# Patient Record
Sex: Male | Born: 1995
Health system: Southern US, Community
[De-identification: ages and names within clinical notes are randomized; demographics above are authoritative.]

## PROBLEM LIST (undated history)

## (undated) DIAGNOSIS — Z91030 Bee allergy status: Secondary | ICD-10-CM

## (undated) HISTORY — DX: Bee allergy status: Z91.030

## (undated) HISTORY — PX: MYRINGOTOMY: SUR874

---
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
## 2017-08-22 ENCOUNTER — Ambulatory Visit (INDEPENDENT_AMBULATORY_CARE_PROVIDER_SITE_OTHER): Payer: PRIVATE HEALTH INSURANCE | Admitting: Family Medicine

## 2017-08-22 ENCOUNTER — Encounter: Payer: Self-pay | Admitting: Family Medicine

## 2017-08-22 DIAGNOSIS — J029 Acute pharyngitis, unspecified: Secondary | ICD-10-CM | POA: Diagnosis not present

## 2017-08-22 MED ORDER — EPINEPHRINE 0.3 MG/0.3ML IJ SOAJ: 0.3000 mg | Freq: Once | INTRAMUSCULAR | 2 refills | Status: AC

## 2017-08-22 MED ORDER — AMOXICILLIN-POT CLAVULANATE 875-125 MG PO TABS: 1.0000 | ORAL_TABLET | Freq: Two times a day (BID) | ORAL | 0 refills | Status: DC

## 2017-08-22 NOTE — Progress Notes (Signed)
Subjective:    Patient ID: Brandon Mayer, male    DOB: 29-Jan-1996, 22 y.o.   MRN: [id number]  HPI  Here for ST and ear pain  Some chills and then hot   Started getting sick on Monday night- R ear hurt a lot  Then yesterday throat started to hurt / last night everything hurt-both ears Hurts to swallow   Some cough- not bad / can feel some phlegm but it will not come up   Nose is a little runny   Headache-not facial pain/ not severe/comes and goes   Family has been sick with uri symptoms   Temp: 100.3 F (37.9 C)  Ibuprofen - last dose at noon  Cough drops   No other otc medicines  Drinking water   Has never had mono    Patient Active Problem List   Diagnosis Date Noted  . Pharyngitis 08/22/2017  . Diarrhea 08/15/2015  . Well child check 12/09/2010  . Bee sting allergy 12/09/2010   Past Medical History:  Diagnosis Date  . Bee sting allergy    Past Surgical History:  Procedure Laterality Date  . MYRINGOTOMY     Social History   Tobacco Use  . Smoking status: Never Smoker  Substance Use Topics  . Alcohol use: No  . Drug use: No   Family History  Problem Relation Age of Onset  . Gestational diabetes Mother   . Heart murmur Mother    No Known Allergies No current outpatient medications on file prior to visit.   No current facility-administered medications on file prior to visit.      Review of Systems  Constitutional: Positive for fatigue and fever. Negative for activity change, appetite change and unexpected weight change.  HENT: Positive for ear pain, postnasal drip, sore throat, trouble swallowing and voice change. Negative for congestion, drooling, facial swelling, mouth sores, rhinorrhea, sinus pressure and sinus pain.   Eyes: Negative for pain, redness, itching and visual disturbance.  Respiratory: Negative for cough, chest tightness, shortness of breath and wheezing.   Cardiovascular: Negative for chest pain and palpitations.    Gastrointestinal: Negative for abdominal pain, blood in stool, constipation, diarrhea and nausea.  Endocrine: Negative for cold intolerance, heat intolerance, polydipsia and polyuria.  Genitourinary: Negative for difficulty urinating, dysuria, frequency and urgency.  Musculoskeletal: Negative for arthralgias, joint swelling and myalgias.  Skin: Negative for pallor and rash.  Neurological: Negative for dizziness, tremors, weakness, numbness and headaches.  Hematological: Negative for adenopathy. Does not bruise/bleed easily.  Psychiatric/Behavioral: Negative for decreased concentration and dysphoric mood. The patient is not nervous/anxious.        Objective:   Physical Exam  Constitutional: He appears well-developed and well-nourished. He does not appear ill.  Well appearing - but fatigued   HENT:  Head: Normocephalic and atraumatic.  Right Ear: Tympanic membrane and ear canal normal. No drainage, swelling or tenderness. No middle ear effusion.  Left Ear: Tympanic membrane and ear canal normal. No drainage, swelling or tenderness.  No middle ear effusion.  Mouth/Throat: Mucous membranes are normal. No oral lesions. No uvula swelling. Oropharyngeal exudate, posterior oropharyngeal edema and posterior oropharyngeal erythema present. No tonsillar abscesses. Tonsils are 1+ on the right. Tonsils are 1+ on the left. Tonsillar exudate.  Diffusely swollen tonsils with bright erythema  Scant exudate on R side   Eyes: Pupils are equal, round, and reactive to light. EOM are normal.  Neck: Normal range of motion. Neck supple. No thyromegaly present.  Cardiovascular:  Regular rhythm.  No murmur heard. Pulmonary/Chest: Effort normal and breath sounds normal. No stridor. No respiratory distress. He has no wheezes. He has no rhonchi. He has no rales. He exhibits no tenderness.  Lymphadenopathy:    He has no cervical adenopathy.  Neurological: He is alert.  Skin: Skin is warm and dry. No rash noted.   Psychiatric: He has a normal mood and affect.          Assessment & Plan:   Problem List Items Addressed This Visit      Respiratory   Pharyngitis    Throat pain and swelling w/o palpable adenopathy  Some exudate Neg RST Will cx and cover with augmentin in light of severity  Disc symptomatic care - see instructions on AVS Rest/fluids  Consider mono test if no imp (less likely with no LNs)      Relevant Orders   Culture, Group A Strep

## 2017-08-22 NOTE — Patient Instructions (Signed)
Drink lots of fluids  Ibuprofen - make sure something to eat with that  You can take 3 at a time (600 mg) up to every 8 hours   Chloraseptic throat spray can help  Watch for rash and let me know   I sent a throat culture - we will contact you with results   Take the augmentin as directed  Take with a little food if you can   Get some rest   If you cannot swallow - or any breathing problems-please go to the emergency room

## 2017-08-23 NOTE — Assessment & Plan Note (Signed)
Throat pain and swelling w/o palpable adenopathy  Some exudate Neg RST Will cx and cover with augmentin in light of severity  Disc symptomatic care - see instructions on AVS Rest/fluids  Consider mono test if no imp (less likely with no LNs)

## 2017-08-24 LAB — CULTURE, GROUP A STREP
MICRO NUMBER:: 90531636
SPECIMEN QUALITY:: ADEQUATE

## 2018-01-17 ENCOUNTER — Encounter: Payer: Self-pay | Admitting: Family Medicine

## 2018-01-17 ENCOUNTER — Ambulatory Visit (INDEPENDENT_AMBULATORY_CARE_PROVIDER_SITE_OTHER): Payer: Commercial Managed Care - PPO | Admitting: Family Medicine

## 2018-01-17 ENCOUNTER — Encounter (INDEPENDENT_AMBULATORY_CARE_PROVIDER_SITE_OTHER): Payer: Self-pay

## 2018-01-17 VITALS — BP 136/78 | HR 82 | Temp 99.1°F | Ht 71.0 in | Wt 254.8 lb

## 2018-01-17 DIAGNOSIS — Z0000 Encounter for general adult medical examination without abnormal findings: Secondary | ICD-10-CM | POA: Diagnosis not present

## 2018-01-17 DIAGNOSIS — Z23 Encounter for immunization: Secondary | ICD-10-CM

## 2018-01-17 LAB — CBC WITH DIFFERENTIAL/PLATELET
BASOS ABS: 0.1 10*3/uL (ref 0.0–0.1)
Basophils Relative: 1.2 % (ref 0.0–3.0)
EOS ABS: 0.1 10*3/uL (ref 0.0–0.7)
Eosinophils Relative: 1.7 % (ref 0.0–5.0)
HCT: 48.3 % (ref 39.0–52.0)
Hemoglobin: 16.5 g/dL (ref 13.0–17.0)
LYMPHS ABS: 2 10*3/uL (ref 0.7–4.0)
LYMPHS PCT: 30.6 % (ref 12.0–46.0)
MCHC: 34.2 g/dL (ref 30.0–36.0)
MCV: 84.6 fl (ref 78.0–100.0)
Monocytes Absolute: 0.6 10*3/uL (ref 0.1–1.0)
Monocytes Relative: 9.6 % (ref 3.0–12.0)
NEUTROS ABS: 3.7 10*3/uL (ref 1.4–7.7)
NEUTROS PCT: 56.9 % (ref 43.0–77.0)
PLATELETS: 327 10*3/uL (ref 150.0–400.0)
RBC: 5.71 Mil/uL (ref 4.22–5.81)
RDW: 12.7 % (ref 11.5–14.6)
WBC: 6.5 10*3/uL (ref 4.5–10.5)

## 2018-01-17 LAB — COMPREHENSIVE METABOLIC PANEL
ALT: 49 U/L (ref 0–53)
AST: 24 U/L (ref 0–37)
Albumin: 5 g/dL (ref 3.5–5.2)
Alkaline Phosphatase: 57 U/L (ref 39–117)
BILIRUBIN TOTAL: 0.8 mg/dL (ref 0.2–1.2)
BUN: 13 mg/dL (ref 6–23)
CO2: 29 meq/L (ref 19–32)
CREATININE: 0.81 mg/dL (ref 0.40–1.50)
Calcium: 10 mg/dL (ref 8.4–10.5)
Chloride: 101 mEq/L (ref 96–112)
GFR: 129.02 mL/min (ref >60.00)
GLUCOSE: 94 mg/dL (ref 70–99)
Potassium: 4.3 mEq/L (ref 3.5–5.1)
Sodium: 139 mEq/L (ref 135–145)
Total Protein: 7.9 g/dL (ref 6.0–8.3)

## 2018-01-17 LAB — LIPID PANEL
CHOL/HDL RATIO: 4
Cholesterol: 157 mg/dL (ref 0–200)
HDL: 38.9 mg/dL — ABNORMAL LOW (ref >39.00)
LDL Cholesterol: 109 mg/dL — ABNORMAL HIGH (ref 0–99)
NONHDL: 118.21
Triglycerides: 46 mg/dL (ref 0.0–149.0)
VLDL: 9.2 mg/dL (ref 0.0–40.0)

## 2018-01-17 LAB — TSH: TSH: 1.56 u[IU]/mL (ref 0.35–5.50)

## 2018-01-17 NOTE — Progress Notes (Signed)
Subjective:    Patient ID: Brandon Mayer, male    DOB: [birth year], 22 y.o.   MRN: [id number]  HPI  Here for health maintenance exam and to review chronic medical problems    Grad with Games developer degree Works at [employer] ([employer]) -- really likes it  Likes it   [employer] from Last 3 Encounters:  01/17/18 254 lb 12 oz (115.6 kg)  08/22/17 259 lb (117.5 kg) (>99 %, Z= 2.54)*  10/15/13 236 lb (107 kg) (>99 %, Z= 2.67)*   * Growth percentiles are based on CDC (Boys, 2-20 Years) data.  is taking care of himself  Started a healthier diet recently - eating less bread and more protein and veg and fruit  Exercise- walking / playing with dog (working long hours)  35.53 kg/m   HIV/STD screening  Not interested /not needed   Tetanus shot 3/09 - needs it   Flu shot -will get at work   HPV vaccine -not interested   fam hx  Mother - gestational DM and heart M MGF with prostate cancer - late in life   Has a form to fill out for work   Needs labs   Patient Active Problem List   Diagnosis Date Noted  . Routine general medical examination at a health care facility 01/17/2018  . Diarrhea 08/15/2015  . Well child check 12/09/2010  . Bee sting allergy 12/09/2010   Past Medical History:  Diagnosis Date  . Bee sting allergy    Past Surgical History:  Procedure Laterality Date  . MYRINGOTOMY     Social History   Tobacco Use  . Smoking status: Never Smoker  . Smokeless tobacco: Never Used  Substance Use Topics  . Alcohol use: No  . Drug use: No   Family History  Problem Relation Age of Onset  . Gestational diabetes Mother   . Heart murmur Mother    No Known Allergies No current outpatient medications on file prior to visit.   No current facility-administered medications on file prior to visit.      Review of Systems  Constitutional: Negative for activity change, appetite change, fatigue, fever and unexpected weight change.  HENT: Negative for  congestion, rhinorrhea, sore throat and trouble swallowing.   Eyes: Negative for pain, redness, itching and visual disturbance.  Respiratory: Negative for cough, chest tightness, shortness of breath and wheezing.   Cardiovascular: Negative for chest pain and palpitations.  Gastrointestinal: Negative for abdominal pain, blood in stool, constipation, diarrhea and nausea.  Endocrine: Negative for cold intolerance, heat intolerance, polydipsia and polyuria.  Genitourinary: Negative for difficulty urinating, dysuria, frequency and urgency.  Musculoskeletal: Negative for arthralgias, joint swelling and myalgias.  Skin: Negative for pallor and rash.  Neurological: Negative for dizziness, tremors, weakness, numbness and headaches.  Hematological: Negative for adenopathy. Does not bruise/bleed easily.  Psychiatric/Behavioral: Negative for decreased concentration and dysphoric mood. The patient is not nervous/anxious.        Objective:   Physical Exam  Constitutional: He appears well-developed and well-nourished. No distress.  obese and well appearing   HENT:  Head: Normocephalic and atraumatic.  Right Ear: External ear normal.  Left Ear: External ear normal.  Nose: Nose normal.  Mouth/Throat: Oropharynx is clear and moist.  Eyes: Pupils are equal, round, and reactive to light. Conjunctivae and EOM are normal. Right eye exhibits no discharge. Left eye exhibits no discharge. No scleral icterus.  Neck: Normal range of motion. Neck supple. No JVD present. Carotid  bruit is not present. No thyromegaly present.  Cardiovascular: Normal rate, regular rhythm, normal heart sounds and intact distal pulses. Exam reveals no gallop.  Pulmonary/Chest: Effort normal and breath sounds normal. No respiratory distress. He has no wheezes. He exhibits no tenderness.  Abdominal: Soft. Bowel sounds are normal. He exhibits no distension, no abdominal bruit and no mass. There is no tenderness.  Musculoskeletal: He  exhibits no edema, tenderness or deformity.  Lymphadenopathy:    He has no cervical adenopathy.  Neurological: He is alert. He has normal reflexes. He displays normal reflexes. No cranial nerve deficit. He exhibits normal muscle tone. Coordination normal.  Skin: Skin is warm and dry. No rash noted. No erythema. No pallor.  Few skin tags on neck  Small brown nevi /flat on back   Psychiatric: He has a normal mood and affect. His mood appears not anxious. He does not exhibit a depressed mood.  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

## 2018-01-17 NOTE — Patient Instructions (Signed)
Take care of yourself  Keep working on healthy diet and exercise  Also -work to get enough sleep  Labs today  I will get your form finished

## 2018-01-17 NOTE — Assessment & Plan Note (Signed)
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

## 2018-05-17 ENCOUNTER — Ambulatory Visit: Payer: Commercial Managed Care - PPO | Admitting: Family Medicine

## 2018-05-17 ENCOUNTER — Encounter: Payer: Self-pay | Admitting: Family Medicine

## 2018-05-17 VITALS — BP 130/76 | HR 89 | Temp 98.2°F | Ht 71.0 in | Wt 260.5 lb

## 2018-05-17 DIAGNOSIS — R109 Unspecified abdominal pain: Secondary | ICD-10-CM | POA: Insufficient documentation

## 2018-05-17 DIAGNOSIS — R110 Nausea: Secondary | ICD-10-CM | POA: Insufficient documentation

## 2018-05-17 DIAGNOSIS — R1084 Generalized abdominal pain: Secondary | ICD-10-CM | POA: Diagnosis not present

## 2018-05-17 NOTE — Patient Instructions (Signed)
Eat a little cleaner  Less processed/junk food  Less fats   More fruits and vegetables  Continue good water intake   Avoid red meat/ fried foods/ egg yolks/ fatty breakfast meats/ butter, cheese and high fat dairy/ and shellfish    When you have an episode -make note of what you ate   We will set up an ultrasound and take a look at your gallbladder

## 2018-05-17 NOTE — Assessment & Plan Note (Signed)
With abd pain after eating  See A/P for abd pain  [place] abd ordered

## 2018-05-17 NOTE — Progress Notes (Signed)
Subjective:    Patient ID: Brandon Mayer, male    DOB: 09-02-1995, 23 y.o.   MRN: [id number]  HPI  Here for symptom of abd pain after eating   Wt Readings from Last 3 Encounters:  05/17/18 260 lb 8 oz (118.2 kg)  01/17/18 254 lb 12 oz (115.6 kg)  08/22/17 259 lb (117.5 kg) (>99 %, Z= 2.54)*   * Growth percentiles are based on CDC (Boys, 2-20 Years) data.   36.33 kg/m   I get rather sick after he eats  About once per week   For instance - ate lunch this weekend  15 min later- feel bad  Nauseated but no vomiting  abd pain and bloating (whole abdomen) - achey pain  No cramping  Not worse on one side or the other  Sometimes gets diarrhea /not every time  No blood in stool No black stool  No fever   Once it was a milk shake - ? Lactose intol Other times nothing to do with dairy   Tried bentyl in the past-no help   No family hx of GI problems   He drinks occasional coffee/not much Alcohol - every 2 weeks at most   Diet is generally not healthy   No anxiety or stressors lately  Had labs in the fall Lab Results  Component Value Date   WBC 6.5 01/17/2018   HGB 16.5 01/17/2018   HCT 48.3 01/17/2018   MCV 84.6 01/17/2018   PLT 327.0 01/17/2018   Lab Results  Component Value Date   ALT 49 01/17/2018   AST 24 01/17/2018   ALKPHOS 57 01/17/2018   BILITOT 0.8 01/17/2018   Lab Results  Component Value Date   CREATININE 0.81 01/17/2018   BUN 13 01/17/2018   NA 139 01/17/2018   K 4.3 01/17/2018   CL 101 01/17/2018   CO2 29 01/17/2018     Patient Active Problem List   Diagnosis Date Noted  . Abdominal pain 05/17/2018  . Nausea 05/17/2018  . Routine general medical examination at a health care facility 01/17/2018  . Bee sting allergy 12/09/2010   Past Medical History:  Diagnosis Date  . Bee sting allergy    Past Surgical History:  Procedure Laterality Date  . MYRINGOTOMY     Social History   Tobacco Use  . Smoking status: Never Smoker  .  Smokeless tobacco: Never Used  Substance Use Topics  . Alcohol use: No  . Drug use: No   Family History  Problem Relation Age of Onset  . Gestational diabetes Mother   . Heart murmur Mother    No Known Allergies No current outpatient medications on file prior to visit.   No current facility-administered medications on file prior to visit.     Review of Systems  Constitutional: Negative for activity change, appetite change, fatigue, fever and unexpected weight change.  HENT: Negative for congestion, rhinorrhea, sore throat and trouble swallowing.   Eyes: Negative for pain, redness, itching and visual disturbance.  Respiratory: Negative for cough, chest tightness, shortness of breath and wheezing.   Cardiovascular: Negative for chest pain, palpitations and leg swelling.  Gastrointestinal: Positive for abdominal distention, abdominal pain, diarrhea and nausea. Negative for anal bleeding, blood in stool, constipation, rectal pain and vomiting.  Endocrine: Negative for cold intolerance, heat intolerance, polydipsia and polyuria.  Genitourinary: Negative for difficulty urinating, dysuria, frequency and urgency.  Musculoskeletal: Negative for arthralgias, joint swelling and myalgias.  Skin: Negative for pallor and rash.  Neurological: Negative for dizziness, tremors, weakness, numbness and headaches.  Hematological: Negative for adenopathy. Does not bruise/bleed easily.  Psychiatric/Behavioral: Negative for decreased concentration and dysphoric mood. The patient is not nervous/anxious.        Mood is good Not stressed        Objective:   Physical Exam Constitutional:      General: He is not in acute distress.    Appearance: He is well-developed.     Comments: obese and well appearing   HENT:     Head: Normocephalic and atraumatic.     Mouth/Throat:     Mouth: Mucous membranes are moist.     Pharynx: Oropharynx is clear.  Eyes:     General: No scleral icterus.     Conjunctiva/sclera: Conjunctivae normal.     Pupils: Pupils are equal, round, and reactive to light.  Neck:     Musculoskeletal: Normal range of motion and neck supple.  Cardiovascular:     Rate and Rhythm: Normal rate and regular rhythm.     Heart sounds: Normal heart sounds.  Pulmonary:     Effort: Pulmonary effort is normal. No respiratory distress.     Breath sounds: Normal breath sounds. No wheezing or rales.  Abdominal:     General: Bowel sounds are normal. There is no distension.     Palpations: Abdomen is soft. There is no mass.     Tenderness: There is no abdominal tenderness. There is no right CVA tenderness, left CVA tenderness, guarding or rebound. Negative signs include Murphy's sign and McBurney's sign.     Hernia: No hernia is present.  Lymphadenopathy:     Cervical: No cervical adenopathy.  Skin:    General: Skin is warm and dry.     Capillary Refill: Capillary refill takes less than 2 seconds.     Coloration: Skin is not jaundiced or pale.     Findings: No erythema.  Neurological:     General: No focal deficit present.     Mental Status: He is alert.  Psychiatric:        Mood and Affect: Mood normal.           Assessment & Plan:   Problem List Items Addressed This Visit      Other   Abdominal pain - Primary    Intermittent episodes of moderate abdominal pain /nausea and malaise after eating (about 15 to 60 min after eating)  Diarrhea with some episodes but not all Tried bentyl for ? IBS in the past- did not help Unsure what causes it - fatty food is possible In general high fat and processed diet  Nl exam today/feels ok  [place] abd ordered (to look for gallstones)  Adv strongly to eat cleaner (less processed/fat and more fruit/veg)  He will keep a journal of what he eats before an episode  Plan to follow result If severe episode-will go to ED       Relevant Orders   US Abdomen Complete   Nausea    With abd pain after eating  See A/P for abd pain    [place] abd ordered       Relevant Orders   US Abdomen Complete

## 2018-05-17 NOTE — Assessment & Plan Note (Signed)
Intermittent episodes of moderate abdominal pain /nausea and malaise after eating (about 15 to 60 min after eating)  Diarrhea with some episodes but not all Tried bentyl for ? IBS in the past- did not help Unsure what causes it - fatty food is possible In general high fat and processed diet  Nl exam today/feels ok  [place] abd ordered (to look for gallstones)  Adv strongly to eat cleaner (less processed/fat and more fruit/veg)  He will keep a journal of what he eats before an episode  Plan to follow result If severe episode-will go to ED

## 2018-05-24 ENCOUNTER — Ambulatory Visit
Admission: RE | Admit: 2018-05-24 | Discharge: 2018-05-24 | Disposition: A | Discharge status: Home / Self Care | Payer: Commercial Managed Care - PPO | Source: Ambulatory Visit | Attending: Family Medicine | Admitting: Family Medicine

## 2018-05-24 DIAGNOSIS — K7689 Other specified diseases of liver: Secondary | ICD-10-CM | POA: Diagnosis not present

## 2018-05-24 DIAGNOSIS — R110 Nausea: Secondary | ICD-10-CM

## 2018-05-24 DIAGNOSIS — R1084 Generalized abdominal pain: Secondary | ICD-10-CM

## 2018-05-28 ENCOUNTER — Telehealth: Payer: Self-pay | Admitting: Family Medicine

## 2018-05-28 DIAGNOSIS — K769 Liver disease, unspecified: Secondary | ICD-10-CM | POA: Insufficient documentation

## 2018-05-28 NOTE — Telephone Encounter (Signed)
-----   Message from Shon Millet, [place] sent at 05/28/2018 12:41 PM EST ----- Pt notified of [place] results and Dr. Royden Purl comments. Pt does agree with MRI please put referral in and I advise pt our [facility] will call to schedule appt. Pt said his sxs have improved, he hasn't had much diarrhea nausea or abd pain, but he hasn't had much of an appetite so he really hadn't been eating so not sure if that's why sxs are better.

## 2018-05-28 NOTE — Telephone Encounter (Signed)
Order done Will route to [facility] 

## 2018-06-03 ENCOUNTER — Other Ambulatory Visit: Payer: Self-pay | Admitting: Family Medicine

## 2018-06-04 ENCOUNTER — Other Ambulatory Visit: Payer: Self-pay | Admitting: Family Medicine

## 2018-06-08 ENCOUNTER — Ambulatory Visit
Admission: RE | Admit: 2018-06-08 | Discharge: 2018-06-08 | Disposition: A | Discharge status: Home / Self Care | Payer: Commercial Managed Care - PPO | Source: Ambulatory Visit | Attending: Family Medicine | Admitting: Family Medicine

## 2018-06-08 DIAGNOSIS — K769 Liver disease, unspecified: Secondary | ICD-10-CM

## 2018-06-08 DIAGNOSIS — K7689 Other specified diseases of liver: Secondary | ICD-10-CM | POA: Diagnosis not present

## 2018-06-08 MED ORDER — GADOBENATE DIMEGLUMINE 529 MG/ML IV SOLN
20.0000 mL | Freq: Once | INTRAVENOUS | Status: AC | PRN
  Administered 2018-06-08: 20 mL via INTRAVENOUS

## 2019-03-11 IMAGING — US US ABDOMEN COMPLETE
1 series · 13 of 25 positions shown · non-contrast
Comparison: CT scan from 5255.

CLINICAL DATA: Intermittent postprandial abdominal pain. Nausea and
diarrhea

EXAM:
ABDOMEN ULTRASOUND COMPLETE

[Series 1: us abdomen complete · 0.22mm/px · 13 of 83 slices shown]
[im 1/83]
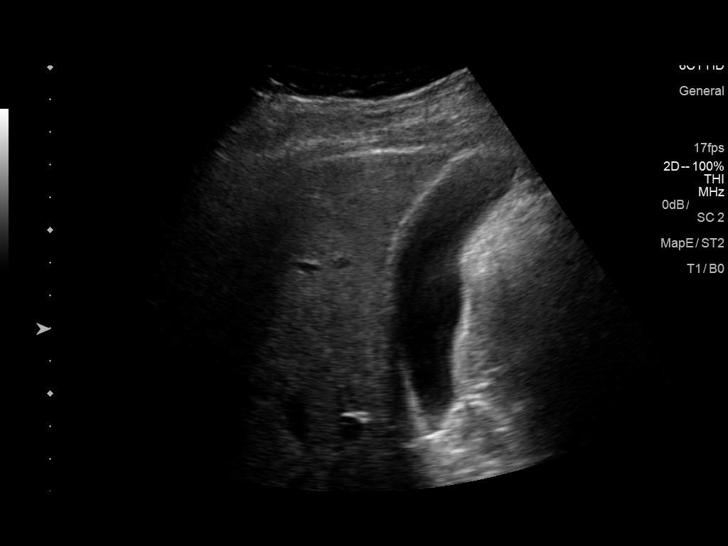
[im 7/83]
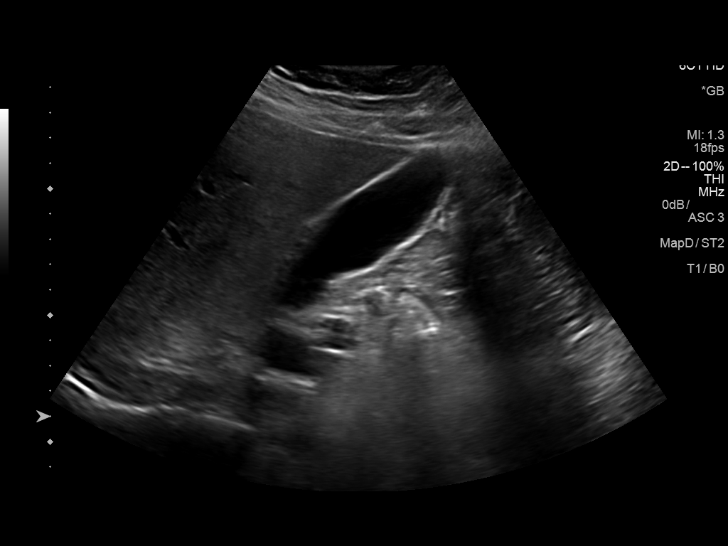
[im 14/83]
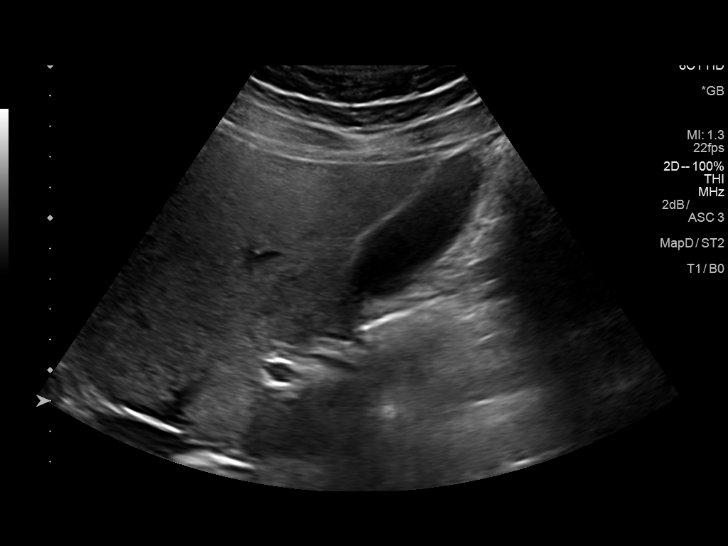
[im 21/83]
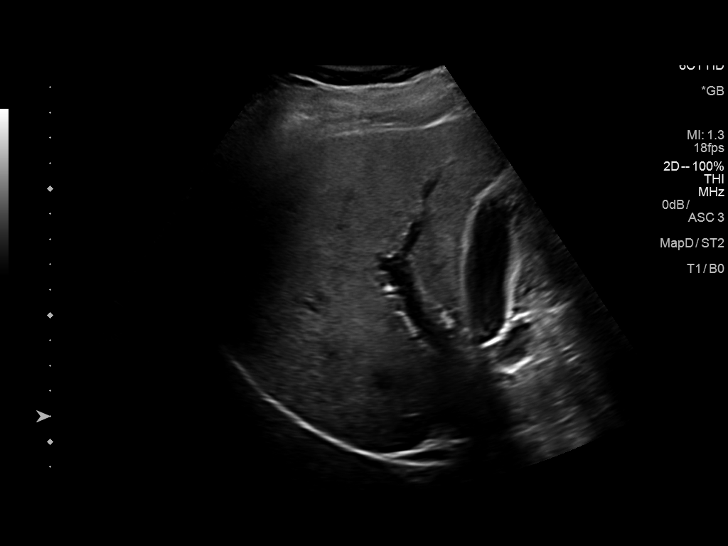
[im 28/83]
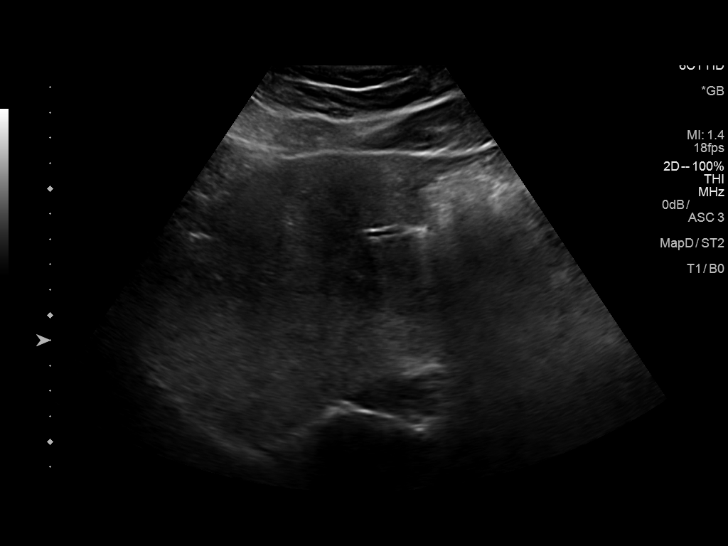
[im 35/83]
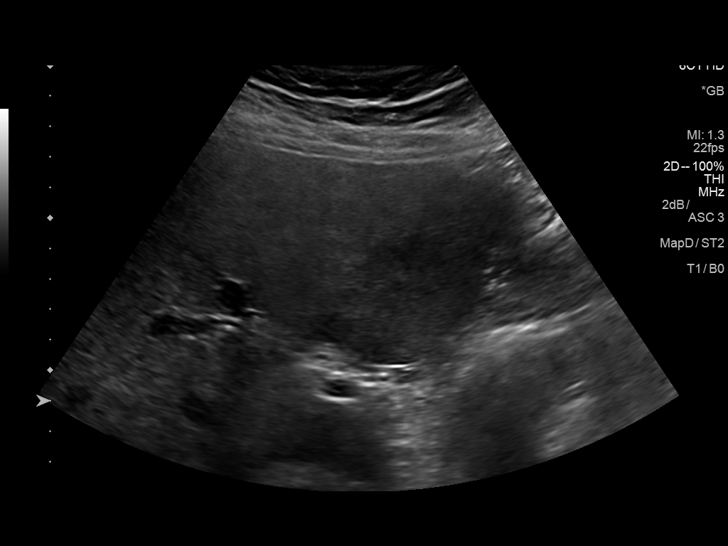
[im 42/83]
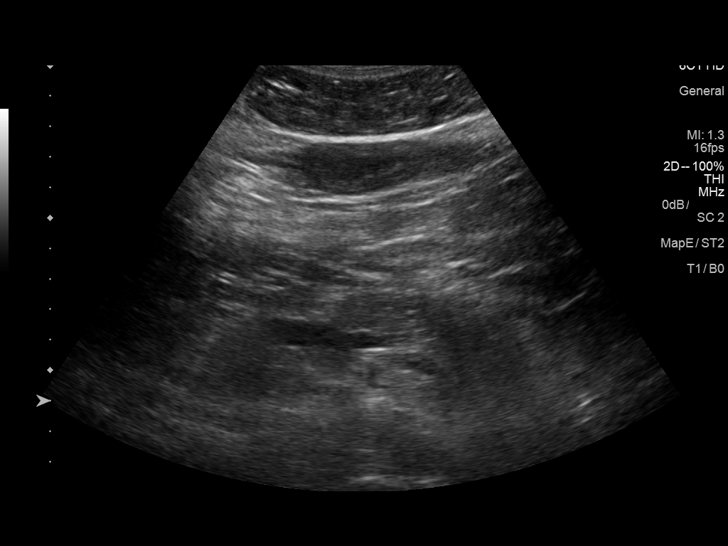
[im 48/83]
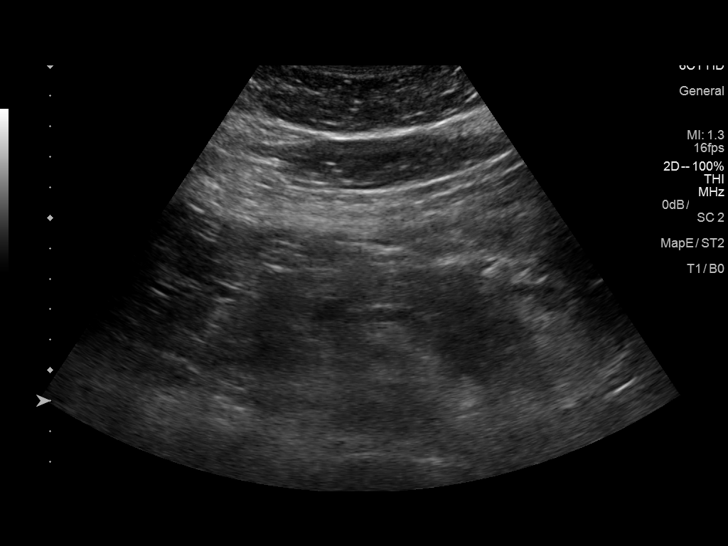
[im 55/83]
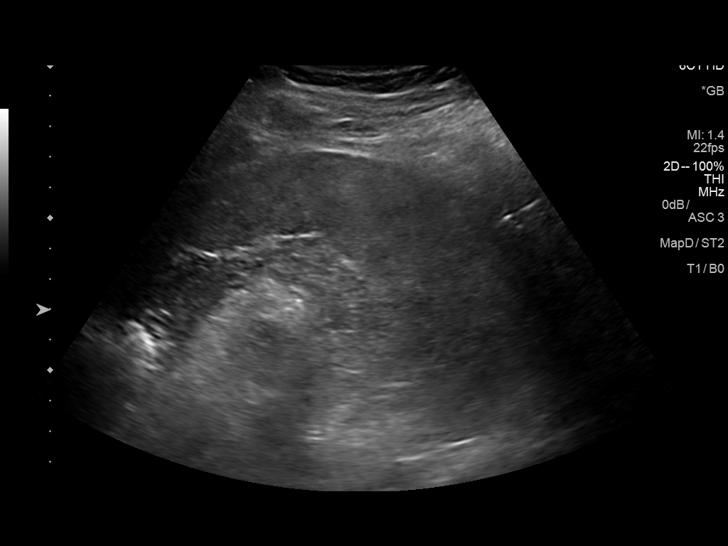
[im 62/83]
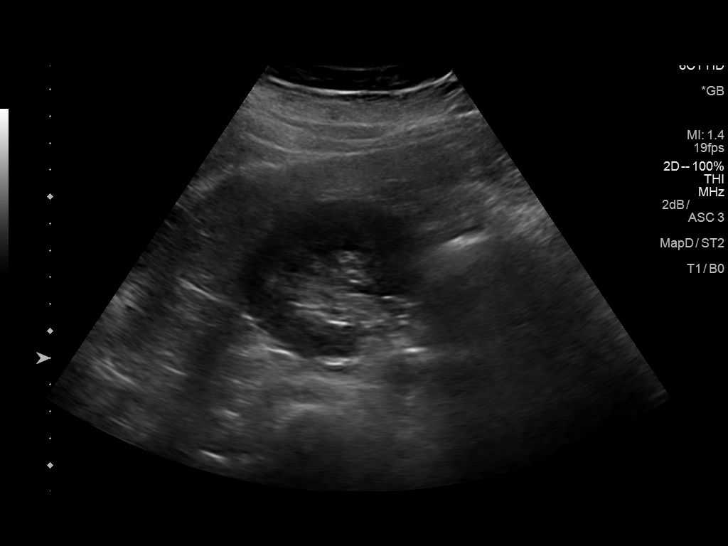
[im 69/83]
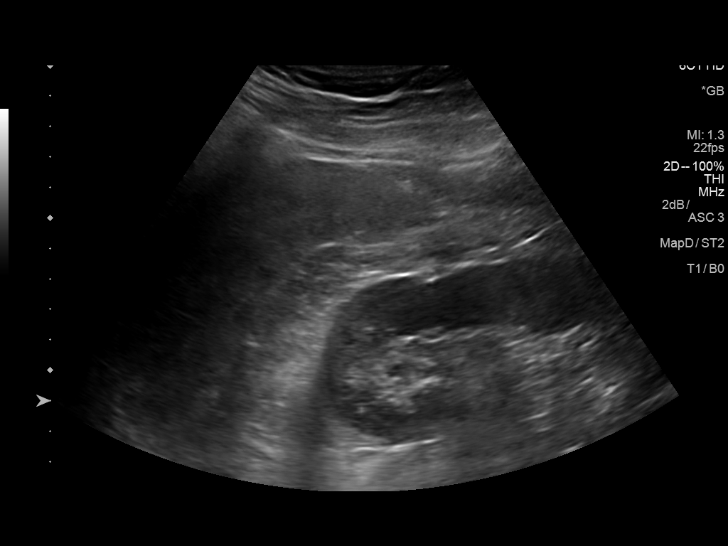
[im 76/83]
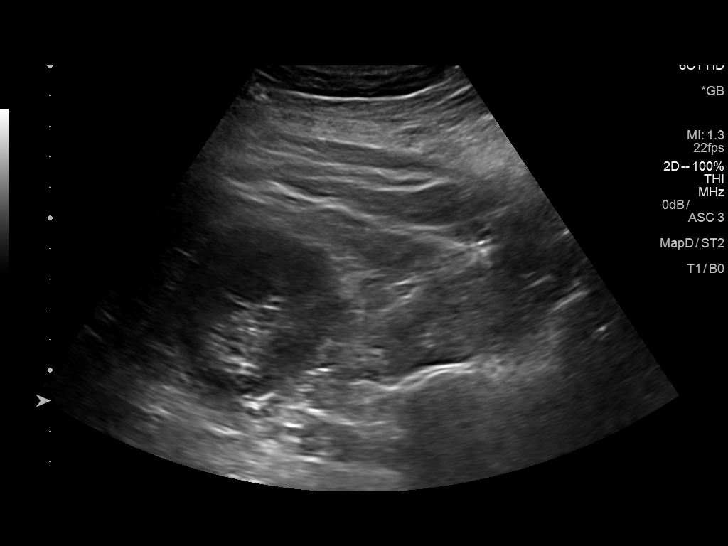
[im 83/83]
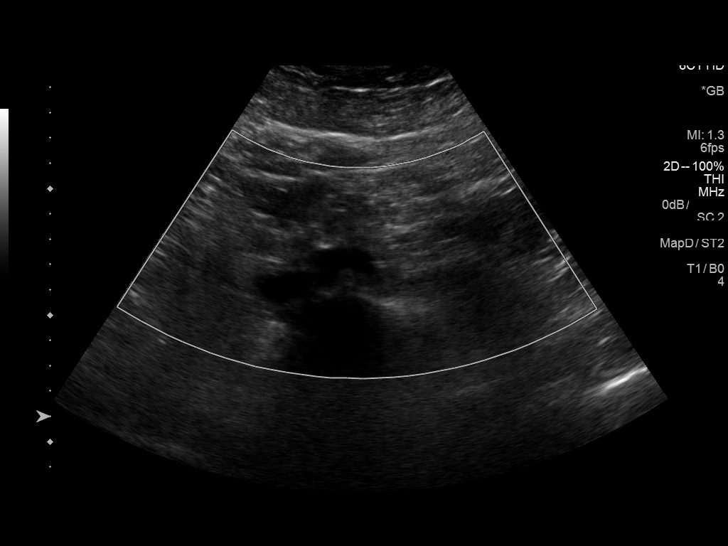

[13 of 25 positions shown; findings below may reference images not displayed]

FINDINGS: Gallbladder: No gallstones or wall thickening visualized. No
sonographic Murphy sign noted by sonographer.

Common bile duct: Diameter: 4.2 mm

Liver: Normal appearing hepatic echogenicity. No intrahepatic
biliary dilatation. Ill-defined area of decreased echogenicity in
the left lobe near the gallbladder fossa is an indeterminate
finding. MRI without and with contrast is recommended for further
evaluation. Portal vein is patent on color Doppler imaging with
normal direction of blood flow towards the liver.

IVC: Normal caliber.

Pancreas: Sonographically normal.

Spleen: Normal size and echogenicity.  No focal lesions.

Right Kidney: Length: 12.5 cm. Normal renal cortical thickness and
echogenicity without focal lesions or hydronephrosis.

Left Kidney: Length: 12.9 cm. Normal renal cortical thickness and
echogenicity without focal lesions or hydronephrosis.

Abdominal aorta: Normal caliber

Other findings: No ascites
IMPRESSION: 1. Ill-defined 3.8 x 3.1 x 3.4 cm left lobe lesion. Recommend MRI
abdomen without and with contrast for further evaluation.
2. Normal gallbladder and normal caliber common bile duct.
3. Normal sonographic appearance of the pancreas, spleen and both
kidneys.

## 2019-07-17 ENCOUNTER — Ambulatory Visit: Payer: Commercial Managed Care - PPO | Admitting: Family Medicine

## 2019-07-17 ENCOUNTER — Other Ambulatory Visit: Payer: Self-pay

## 2019-07-17 ENCOUNTER — Encounter: Payer: Self-pay | Admitting: Family Medicine

## 2019-07-17 VITALS — BP 122/82 | HR 122 | Temp 98.1°F | Ht 71.0 in | Wt 273.7 lb

## 2019-07-17 DIAGNOSIS — R0789 Other chest pain: Secondary | ICD-10-CM | POA: Diagnosis not present

## 2019-07-17 DIAGNOSIS — F410 Panic disorder [episodic paroxysmal anxiety] without agoraphobia: Secondary | ICD-10-CM | POA: Diagnosis not present

## 2019-07-17 MED ORDER — HYDROXYZINE HCL 25 MG PO TABS: 25.0000 mg | ORAL_TABLET | Freq: Every day | ORAL | 0 refills | Status: DC | PRN

## 2019-07-17 NOTE — Assessment & Plan Note (Addendum)
Episode of acute chest wall pain after turning over in bed.  Now just a little residual soreness/tenderness  Reassuring exam  Not typical cardiac pain  It made pt very anxious at the time  If needed inst to use warm compress  If symptoms return inst to call us or seek care at ER if after hours

## 2019-07-17 NOTE — Progress Notes (Signed)
Subjective:    Patient ID: Brandon Mayer, male    DOB: 18-Jul-1995, 24 y.o.   MRN: [id number]  This visit occurred during the SARS-CoV-2 public health emergency.  Safety protocols were in place, including screening questions prior to the visit, additional usage of staff PPE, and extensive cleaning of exam room while observing appropriate contact time as indicated for disinfecting solutions.    HPI Pt presents for symptom of anxiety   Wt Readings from Last 3 Encounters:  07/17/19 273 lb 11.2 oz (124.1 kg)  05/17/18 260 lb 8 oz (118.2 kg)  01/17/18 254 lb 12 oz (115.6 kg)   38.17 kg/m  He woke up with a sharp pain in chest in the middle of the night  It occurred with movement  His dog woke him up and it did not hurt until he rolled over  Got better after he moved around and walked  A little tender in R chest     It sent him into a "freak out"  He made himself sick from 2-9 am  Took a lorazepam (1/2 of a tab from a family member) -it helped a lot  Then took a nap and he felt better   This has never happened before   Mom and sister have bad anxiety   He has never had trouble with anxiety before   Stress level - is moderate at work (at [employer])  Business not affected by the pandemic much but they are understaffed   He himself had covid- lost smell and that was his only symptom 2 months ago  Did not get anxious with that -not very sick   Self care:  [place] off the health wagon with beginning of pandemic  Started doing better after January - then lost 7 lb so far  Some running for exercise or the gym   Tries to eat a healthy diet   GAD score of 4 PHQ 0  Patient Active Problem List   Diagnosis Date Noted  . Panic attack 07/17/2019  . Chest wall pain 07/17/2019  . Liver lesion, left lobe 05/28/2018  . Abdominal pain 05/17/2018  . Nausea 05/17/2018  . Routine general medical examination at a health care facility 01/17/2018  . Bee sting allergy 12/09/2010    Past Medical History:  Diagnosis Date  . Bee sting allergy    Past Surgical History:  Procedure Laterality Date  . MYRINGOTOMY     Social History   Tobacco Use  . Smoking status: Never Smoker  . Smokeless tobacco: Never Used  Substance Use Topics  . Alcohol use: No  . Drug use: No   Family History  Problem Relation Age of Onset  . Gestational diabetes Mother   . Heart murmur Mother    No Known Allergies No current outpatient medications on file prior to visit.   No current facility-administered medications on file prior to visit.    Review of Systems  Constitutional: Negative for activity change, appetite change, fatigue, fever and unexpected weight change.  HENT: Negative for congestion, rhinorrhea, sore throat and trouble swallowing.   Eyes: Negative for pain, redness, itching and visual disturbance.  Respiratory: Negative for cough, chest tightness, shortness of breath and wheezing.        Chest/rib pain last night that is better now   Cardiovascular: Negative for chest pain and palpitations.  Gastrointestinal: Negative for abdominal pain, blood in stool, constipation, diarrhea and nausea.  Endocrine: Negative for cold intolerance, heat intolerance, polydipsia  and polyuria.  Genitourinary: Negative for difficulty urinating, dysuria, frequency and urgency.  Musculoskeletal: Negative for arthralgias, joint swelling and myalgias.  Skin: Negative for pallor and rash.  Neurological: Negative for dizziness, tremors, weakness, numbness and headaches.  Hematological: Negative for adenopathy. Does not bruise/bleed easily.  Psychiatric/Behavioral: Positive for sleep disturbance. Negative for decreased concentration and dysphoric mood. The patient is nervous/anxious.        Panic attack last night is better now        Objective:   Physical Exam Constitutional:      General: He is not in acute distress.    Appearance: Normal appearance. He is obese. He is not  ill-appearing or diaphoretic.  HENT:     Head: Normocephalic and atraumatic.  Eyes:     Extraocular Movements: Extraocular movements intact.     Conjunctiva/sclera: Conjunctivae normal.     Pupils: Pupils are equal, round, and reactive to light.  Neck:     Vascular: No carotid bruit.  Cardiovascular:     Rate and Rhythm: Regular rhythm. Tachycardia present.     Pulses: Normal pulses.     Heart sounds: Normal heart sounds.     Comments: HR slowed considerably during visit to high 90s  Pulmonary:     Effort: Pulmonary effort is normal. No respiratory distress.     Breath sounds: Normal breath sounds. No stridor. No wheezing, rhonchi or rales.     Comments: Slight chest tenderness under R nipple  No step off /crepitus or skin change noted Chest:     Chest wall: Tenderness present.  Abdominal:     General: Abdomen is flat. Bowel sounds are normal. There is no distension.     Palpations: Abdomen is soft. There is no mass.     Tenderness: There is no abdominal tenderness.  Musculoskeletal:        General: No tenderness.     Cervical back: Normal range of motion and neck supple. No tenderness.     Right lower leg: No edema.     Left lower leg: No edema.  Lymphadenopathy:     Cervical: No cervical adenopathy.  Skin:    General: Skin is warm and dry.     Coloration: Skin is not pale.     Findings: No erythema or rash.  Neurological:     Mental Status: He is alert.     Coordination: Coordination normal.     Deep Tendon Reflexes: Reflexes normal.  Psychiatric:        Attention and Perception: Attention normal.        Mood and Affect: Mood is anxious.        Speech: Speech normal.        Behavior: Behavior normal.        Thought Content: Thought content normal.        Cognition and Memory: Cognition and memory normal.     Comments: Anxious mood- per pt much improved from last night  Talks candidly about mood and stressors           Assessment & Plan:   Problem List Items  Addressed This Visit      Other   Panic attack - Primary    Pt has fam hx of anx disorder but no personal hx  Had a panic attack last night during an episode of musculoskeletal pain -now resolved  Disc likely hood of re occurrence and what to do  Enc good self care/continued exercise  Given px for  hydroxyzine to use prn if another episode (with caution of sedation) and inst to alert [place] if this happens      Relevant Medications   hydrOXYzine (ATARAX/VISTARIL) 25 MG tablet   Chest wall pain    Episode of acute chest wall pain after turning over in bed.  Now just a little residual soreness/tenderness  Reassuring exam  Not typical cardiac pain  It made pt very anxious at the time  If needed inst to use warm compress  If symptoms return inst to call us or seek care at ER if after hours

## 2019-07-17 NOTE — Patient Instructions (Addendum)
I think your chest pain was muscular /caused by a position change (? Rib issue) Glad it is better - but if is comes back let me know (or if you have other new symptoms)  Keep working on more healthy diet and exercise  Continue to limit caffeine   I prescribed hydroxyzine to have on hand if you have another panic attack (caution of sedation)

## 2019-07-17 NOTE — Assessment & Plan Note (Signed)
Pt has fam hx of anx disorder but no personal hx  Had a panic attack last night during an episode of musculoskeletal pain -now resolved  Disc likely hood of re occurrence and what to do  Enc good self care/continued exercise  Given px for hydroxyzine to use prn if another episode (with caution of sedation) and inst to alert [place] if this happens

## 2020-03-24 ENCOUNTER — Telehealth (INDEPENDENT_AMBULATORY_CARE_PROVIDER_SITE_OTHER): Payer: Self-pay | Admitting: Family Medicine

## 2020-03-24 ENCOUNTER — Encounter: Payer: Self-pay | Admitting: Family Medicine

## 2020-03-24 ENCOUNTER — Other Ambulatory Visit: Payer: Self-pay

## 2020-03-24 DIAGNOSIS — J069 Acute upper respiratory infection, unspecified: Secondary | ICD-10-CM | POA: Insufficient documentation

## 2020-03-24 NOTE — Progress Notes (Signed)
Virtual Visit via Video Note  I connected with Brandon Mayer on 03/24/20 at 11:15 AM EST by a video enabled telemedicine application and verified that I am speaking with the correct person using two identifiers.  Location: Patient: home Provider: office   I discussed the limitations of evaluation and management by telemedicine and the availability of in person appointments. The patient expressed understanding and agreed to proceed.  Parties involved in encounter  Patient: Brandon Mayer  Provider:  Roxy Manns MD    History of Present Illness: Pt presents with ST and nasal burning sensation   Symptoms started Monday am  Was nauseated , no new diarrhea  No vomiting  Yesterday throat was sore and back of nose was burning  Back of neck was sore- better now  Some nasal congestion  pnd  Cough -dry /mild  No sob or wheezing  No other body aches  No headaches   Feels tired  Overall a lot better today than Monday and tuesday  No fever-temp is normal  Feels cool   Was outdoors at a house with covid -no direct contacts  He stays away from people   He has a test for covid this afternoon - at [facility]   Not immunized for covid   Otc: Robitussin  Drinking fluids   Patient Active Problem List   Diagnosis Date Noted  . Viral URI with cough 03/24/2020  . Panic attack 07/17/2019  . Chest wall pain 07/17/2019  . Liver lesion, left lobe 05/28/2018  . Abdominal pain 05/17/2018  . Nausea 05/17/2018  . Routine general medical examination at a health care facility 01/17/2018  . Bee sting allergy 12/09/2010   Past Medical History:  Diagnosis Date  . Bee sting allergy    Past Surgical History:  Procedure Laterality Date  . MYRINGOTOMY     Social History   Tobacco Use  . Smoking status: Never Smoker  . Smokeless tobacco: Never Used  Substance Use Topics  . Alcohol use: No  . Drug use: No   Family History  Problem Relation Age of Onset  . Gestational diabetes Mother    . Heart murmur Mother    No Known Allergies Current Outpatient Medications on File Prior to Visit  Medication Sig Dispense Refill  . hydrOXYzine (ATARAX/VISTARIL) 25 MG tablet Take 1 tablet (25 mg total) by mouth daily as needed for anxiety (for panic attack). Caution of sedation 20 tablet 0   No current facility-administered medications on file prior to visit.   Review of Systems  Constitutional: Positive for malaise/fatigue. Negative for chills, diaphoresis and fever.  HENT: Positive for congestion and sore throat. Negative for ear pain and sinus pain.   Eyes: Negative for blurred vision, discharge and redness.  Respiratory: Positive for cough. Negative for sputum production, shortness of breath, wheezing and stridor.   Cardiovascular: Negative for chest pain, palpitations and leg swelling.  Gastrointestinal: Positive for nausea. Negative for abdominal pain, diarrhea and vomiting.  Musculoskeletal: Negative for myalgias.  Skin: Negative for rash.  Neurological: Negative for dizziness and headaches.      Observations/Objective: Patient appears fatigued , in no distress Weight is baseline  No facial swelling or asymmetry Normal voice-not hoarse and no slurred speech No obvious tremor or mobility impairment Moving neck and UEs normally Able to hear the call well  occ dry cough, no sob or audible wheezing  Talkative and mentally sharp with no cognitive changes No skin changes on face or neck , no rash  or pallor Affect is normal    Assessment and Plan: Problem List Items Addressed This Visit      Respiratory   Viral URI with cough    Improving from the past 2 d  pnd and ST-improved and no longer nausea  [facility]-inst pt to isolate until that comes back and call us with result regardless Treat symptoms- robitussin, tylenol, antihistamine if needed  Rest/drink fluids Call if symptoms worsen or any fever or sob  Further adv after test results            Follow Up Instructions: Drink fluids and rest  Get your covid test at [facility] and please call us with results  Continue to isolate yourself  Robitussin may help congestion and cough  Tylenol for throat pain  Alert [place] if any new symptoms (including fever)  Update if not starting to improve in a week or if worsening     I discussed the assessment and treatment plan with the patient. The patient was provided an opportunity to ask questions and all were answered. The patient agreed with the plan and demonstrated an understanding of the instructions.   The patient was advised to call back or seek an in-person evaluation if the symptoms worsen or if the condition fails to improve as anticipated.     Roxy Manns, MD

## 2020-03-24 NOTE — Assessment & Plan Note (Signed)
Improving from the past 2 d  pnd and ST-improved and no longer nausea  [facility]-inst pt to isolate until that comes back and call us with result regardless Treat symptoms- robitussin, tylenol, antihistamine if needed  Rest/drink fluids Call if symptoms worsen or any fever or sob  Further adv after test results

## 2020-03-24 NOTE — Patient Instructions (Signed)
Drink fluids and rest  Get your covid test at [facility] and please call us with results  Continue to isolate yourself  Robitussin may help congestion and cough  Tylenol for throat pain  Alert [place] if any new symptoms (including fever)  Update if not starting to improve in a week or if worsening

## 2020-03-26 ENCOUNTER — Telehealth: Payer: Self-pay | Admitting: Family Medicine

## 2020-03-26 MED ORDER — BENZONATATE 200 MG PO CAPS: 200.0000 mg | ORAL_CAPSULE | Freq: Three times a day (TID) | ORAL | 1 refills | Status: DC | PRN

## 2020-03-26 NOTE — Telephone Encounter (Signed)
Patient called in and stated he was tested for covid and it was negative. Pt tested at [facility] on Tuesday and received result Friday. Pt stated only having a cough is his main issue, with mucus. Pt is wondering what he can do now? Please advise.

## 2020-03-26 NOTE — Telephone Encounter (Signed)
I sent in some tessalon to help with cough  Keep [place] posted  Try to sleep propped up a bit also

## 2020-03-26 NOTE — Telephone Encounter (Signed)
Pt said he is already taking those meds and is okay letting it "run it's course" only problem pt is having is he is not sleeping due to the severity of his cough pt said it's worse at night and wanted to see if Dr. Milinda Antis could prescribe or recommend an med to help with sleep

## 2020-03-26 NOTE — Telephone Encounter (Signed)
Thanks for letting me know  Robitussin DM or mucinex DM may be the most helpful for cough and congestion  Fluids and rest when able while this runs its course

## 2020-03-26 NOTE — Telephone Encounter (Signed)
Called pt and no answer and no VM box set up so sent mychart letting pt know

## 2020-03-26 NOTE — Addendum Note (Signed)
Addended by: Roxy Manns A on: 03/26/2020 05:04 PM   Modules accepted: Orders

## 2020-08-30 ENCOUNTER — Encounter: Payer: Self-pay | Admitting: Emergency Medicine

## 2020-08-30 ENCOUNTER — Emergency Department: Payer: PRIVATE HEALTH INSURANCE

## 2020-08-30 ENCOUNTER — Emergency Department
Admission: EM | Admit: 2020-08-30 | Discharge: 2020-08-31 | Disposition: A | Discharge status: Home / Self Care | Payer: PRIVATE HEALTH INSURANCE | Attending: Student in an Organized Health Care Education/Training Program | Admitting: Student in an Organized Health Care Education/Training Program

## 2020-08-30 DIAGNOSIS — N200 Calculus of kidney: Secondary | ICD-10-CM | POA: Insufficient documentation

## 2020-08-30 DIAGNOSIS — R109 Unspecified abdominal pain: Secondary | ICD-10-CM

## 2020-08-30 LAB — URINALYSIS, COMPLETE (UACMP) WITH MICROSCOPIC
Bilirubin Urine: NEGATIVE
Glucose, UA: NEGATIVE mg/dL
Ketones, ur: NEGATIVE mg/dL
Leukocytes,Ua: NEGATIVE
Nitrite: NEGATIVE
Protein, ur: NEGATIVE mg/dL
Specific Gravity, Urine: 1.017 (ref 1.005–1.030)
pH: 6 (ref 5.0–8.0)

## 2020-08-30 LAB — COMPREHENSIVE METABOLIC PANEL WITH GFR
ALT: 36 U/L (ref 0–44)
AST: 25 U/L (ref 15–41)
Albumin: 4.8 g/dL (ref 3.5–5.0)
Alkaline Phosphatase: 54 U/L (ref 38–126)
Anion gap: 11 (ref 5–15)
BUN: 8 mg/dL (ref 6–20)
CO2: 26 mmol/L (ref 22–32)
Calcium: 9.3 mg/dL (ref 8.9–10.3)
Chloride: 102 mmol/L (ref 98–111)
Creatinine, Ser: 0.9 mg/dL (ref 0.61–1.24)
GFR, Estimated: >60 mL/min
Glucose, Bld: 119 mg/dL — ABNORMAL HIGH (ref 70–99)
Potassium: 3.3 mmol/L — ABNORMAL LOW (ref 3.5–5.1)
Sodium: 139 mmol/L (ref 135–145)
Total Bilirubin: 0.7 mg/dL (ref 0.3–1.2)
Total Protein: 7.8 g/dL (ref 6.5–8.1)

## 2020-08-30 LAB — CBC
HCT: 48.2 % (ref 39.0–52.0)
Hemoglobin: 16.4 g/dL (ref 13.0–17.0)
MCH: 28.9 pg (ref 26.0–34.0)
MCHC: 34 g/dL (ref 30.0–36.0)
MCV: 84.9 fL (ref 80.0–100.0)
Platelets: 323 10*3/uL (ref 150–400)
RBC: 5.68 MIL/uL (ref 4.22–5.81)
RDW: 12.4 % (ref 11.5–15.5)
WBC: 9.8 10*3/uL (ref 4.0–10.5)
nRBC: 0 % (ref 0.0–0.2)

## 2020-08-30 LAB — LIPASE, BLOOD: Lipase: 40 U/L (ref 11–51)

## 2020-08-30 MED ORDER — MORPHINE SULFATE (PF) 4 MG/ML IV SOLN: 4.0000 mg | INTRAVENOUS | Status: DC | PRN

## 2020-08-30 MED ORDER — SODIUM CHLORIDE 0.9 % IV BOLUS
1000.0000 mL | Freq: Once | INTRAVENOUS | Status: AC
  Administered 2020-08-30: 1000 mL via INTRAVENOUS

## 2020-08-30 MED ORDER — ONDANSETRON HCL 4 MG/2ML IJ SOLN
4.0000 mg | Freq: Once | INTRAMUSCULAR | Status: AC
  Administered 2020-08-30: 4 mg via INTRAVENOUS
  Filled 2020-08-30: qty 2

## 2020-08-30 MED ORDER — KETOROLAC TROMETHAMINE 30 MG/ML IJ SOLN
15.0000 mg | Freq: Once | INTRAMUSCULAR | Status: AC
  Administered 2020-08-30: 15 mg via INTRAVENOUS
  Filled 2020-08-30: qty 1

## 2020-08-30 NOTE — ED Provider Notes (Signed)
[facility] Emergency Department Provider Note    Event Date/Time   First MD Initiated Contact with Patient 08/30/20 2300     (approximate)  I have reviewed the triage vital signs and the nursing notes.   HISTORY  Chief Complaint Flank Pain    HPI Brandon Mayer is a 25 y.o. male presents to the ER for evaluation of right flank pain associated with hematuria.  Symptoms started a day or 2 ago states is mild at that time.  Felt like he could fight through the weekend but then this evening around 6:00 had sudden more severe pain associate with some nausea.  No fevers.  No history of kidney stones.    Past Medical History:  Diagnosis Date  . Bee sting allergy    Family History  Problem Relation Age of Onset  . Gestational diabetes Mother   . Heart murmur Mother    Past Surgical History:  Procedure Laterality Date  . MYRINGOTOMY     Patient Active Problem List   Diagnosis Date Noted  . Viral URI with cough 03/24/2020  . Panic attack 07/17/2019  . Chest wall pain 07/17/2019  . Liver lesion, left lobe 05/28/2018  . Abdominal pain 05/17/2018  . Nausea 05/17/2018  . Routine general medical examination at a health care facility 01/17/2018  . Bee sting allergy 12/09/2010      Prior to Admission medications   Medication Sig Start Date End Date Taking? Authorizing Provider  ondansetron (ZOFRAN ODT) 4 MG disintegrating tablet Take 1 tablet (4 mg total) by mouth every 8 (eight) hours as needed for nausea or vomiting. 08/31/20  Yes Willy Eddy, MD  oxyCODONE-acetaminophen (PERCOCET) 5-325 MG tablet Take 1 tablet by mouth every 4 (four) hours as needed for severe pain. 08/31/20 08/31/21 Yes Willy Eddy, MD  tamsulosin (FLOMAX) 0.4 MG CAPS capsule Take 1 capsule (0.4 mg total) by mouth daily after supper. 08/31/20  Yes Willy Eddy, MD  benzonatate (TESSALON) 200 MG capsule Take 1 capsule (200 mg total) by mouth 3 (three) times daily as  needed for cough. Swallow whole, do not bite pill 03/26/20   Tower, Audrie Gallus, MD  hydrOXYzine (ATARAX/VISTARIL) 25 MG tablet Take 1 tablet (25 mg total) by mouth daily as needed for anxiety (for panic attack). Caution of sedation 07/17/19   Tower, Audrie Gallus, MD    Allergies Patient has no known allergies.    Social History Social History   Tobacco Use  . Smoking status: Never Smoker  . Smokeless tobacco: Never Used  Substance Use Topics  . Alcohol use: No  . Drug use: No    Review of Systems Patient denies headaches, rhinorrhea, blurry vision, numbness, shortness of breath, chest pain, edema, cough, abdominal pain, nausea, vomiting, diarrhea, dysuria, fevers, rashes or hallucinations unless otherwise stated above in HPI. ____________________________________________   PHYSICAL EXAM:  VITAL SIGNS: Vitals:   08/30/20 2300 08/30/20 2330  BP: 125/78 (!) 150/56  Pulse: (!) 104 99  Resp: 17   Temp:    SpO2: 99% 99%    Constitutional: Alert and oriented.  Eyes: Conjunctivae are normal.  Head: Atraumatic. Nose: No congestion/rhinnorhea. Mouth/Throat: Mucous membranes are moist.   Neck: No stridor. Painless ROM.  Cardiovascular: Normal rate, regular rhythm. Grossly normal heart sounds.  Good peripheral circulation. Respiratory: Normal respiratory effort.  No retractions. Lungs CTAB. Gastrointestinal: Soft and nontender. No distention. No abdominal bruits. No CVA tenderness. Genitourinary:  Musculoskeletal: No lower extremity tenderness nor edema.  No  joint effusions. Neurologic:  Normal speech and language. No gross focal neurologic deficits are appreciated. No facial droop Skin:  Skin is warm, dry and intact. No rash noted. Psychiatric: Mood and affect are normal. Speech and behavior are normal.  ____________________________________________   LABS (all labs ordered are listed, but only abnormal results are displayed)  Results for orders placed or performed during the  hospital encounter of 08/30/20 (from the past 24 hour(s))  Lipase, blood     Status: None   Collection Time: 08/30/20  9:06 PM  Result Value Ref Range   Lipase 40 11 - 51 U/L  Comprehensive metabolic panel     Status: Abnormal   Collection Time: 08/30/20  9:06 PM  Result Value Ref Range   Sodium 139 135 - 145 mmol/L   Potassium 3.3 (L) 3.5 - 5.1 mmol/L   Chloride 102 98 - 111 mmol/L   CO2 26 22 - 32 mmol/L   Glucose, Bld 119 (H) 70 - 99 mg/dL   BUN 8 6 - 20 mg/dL   Creatinine, Ser 4.23 0.61 - 1.24 mg/dL   Calcium 9.3 8.9 - 53.6 mg/dL   Total Protein 7.8 6.5 - 8.1 g/dL   Albumin 4.8 3.5 - 5.0 g/dL   AST 25 15 - 41 U/L   ALT 36 0 - 44 U/L   Alkaline Phosphatase 54 38 - 126 U/L   Total Bilirubin 0.7 0.3 - 1.2 mg/dL   GFR, Estimated >14 >43 mL/min   Anion gap 11 5 - 15  CBC     Status: None   Collection Time: 08/30/20  9:06 PM  Result Value Ref Range   WBC 9.8 4.0 - 10.5 K/uL   RBC 5.68 4.22 - 5.81 MIL/uL   Hemoglobin 16.4 13.0 - 17.0 g/dL   HCT 15.4 00.8 - 67.6 %   MCV 84.9 80.0 - 100.0 fL   MCH 28.9 26.0 - 34.0 pg   MCHC 34.0 30.0 - 36.0 g/dL   RDW 19.5 09.3 - 26.7 %   Platelets 323 150 - 400 K/uL   nRBC 0.0 0.0 - 0.2 %  Urinalysis, Complete w Microscopic     Status: Abnormal   Collection Time: 08/30/20  9:11 PM  Result Value Ref Range   Color, Urine YELLOW (A) YELLOW   APPearance CLEAR (A) CLEAR   Specific Gravity, Urine 1.017 1.005 - 1.030   pH 6.0 5.0 - 8.0   Glucose, UA NEGATIVE NEGATIVE mg/dL   Hgb urine dipstick MODERATE (A) NEGATIVE   Bilirubin Urine NEGATIVE NEGATIVE   Ketones, ur NEGATIVE NEGATIVE mg/dL   Protein, ur NEGATIVE NEGATIVE mg/dL   Nitrite NEGATIVE NEGATIVE   Leukocytes,Ua NEGATIVE NEGATIVE   RBC / HPF 21-50 0 - 5 RBC/hpf   WBC, UA 0-5 0 - 5 WBC/hpf   Bacteria, UA RARE (A) NONE SEEN   Squamous Epithelial / LPF 0-5 0 - 5   Mucus PRESENT     ____________________________________________  ____________________________________________  RADIOLOGY  I personally reviewed all radiographic images ordered to evaluate for the above acute complaints and reviewed radiology reports and findings.  These findings were personally discussed with the patient.  Please see medical record for radiology report.  ____________________________________________   PROCEDURES  Procedure(s) performed:  Procedures    Critical Care performed: no ____________________________________________   INITIAL IMPRESSION / ASSESSMENT AND PLAN / ED COURSE  Pertinent labs & imaging results that were available during my care of the patient were reviewed by me and considered in  my medical decision making (see chart for details).   DDX: Stone, colitis, Pyelo, ureterolithiasis  KOSISOCHUKWU BURNINGHAM is a 25 y.o. who presents to the ED with right flank pain as described above.  Nontoxic-appearing but does appear uncomfortable.  IV pain medication given.  CT imaging consistent with right ureteral stone with mild hydro-.  No sign of infection.  Pain improved.  Stable and appropriate for outpatient follow-up.     The patient was evaluated in Emergency Department today for the symptoms described in the history of present illness. He/she was evaluated in the context of the global COVID-19 pandemic, which necessitated consideration that the patient might be at risk for infection with the SARS-CoV-2 virus that causes COVID-19. Institutional protocols and algorithms that pertain to the evaluation of patients at risk for COVID-19 are in a state of rapid change based on information released by regulatory bodies including the CDC and federal and state organizations. These policies and algorithms were followed during the patient's care in the ED.  As part of my medical decision making, I reviewed the following data within the electronic MEDICAL RECORD NUMBER Nursing notes reviewed and  incorporated, Labs reviewed, notes from prior ED visits and [place] Controlled Substance Database   ____________________________________________   FINAL CLINICAL IMPRESSION(S) / ED DIAGNOSES  Final diagnoses:  Right flank pain  Kidney stone      NEW MEDICATIONS STARTED DURING THIS VISIT:  New Prescriptions   ONDANSETRON (ZOFRAN ODT) 4 MG DISINTEGRATING TABLET    Take 1 tablet (4 mg total) by mouth every 8 (eight) hours as needed for nausea or vomiting.   OXYCODONE-ACETAMINOPHEN (PERCOCET) 5-325 MG TABLET    Take 1 tablet by mouth every 4 (four) hours as needed for severe pain.   TAMSULOSIN (FLOMAX) 0.4 MG CAPS CAPSULE    Take 1 capsule (0.4 mg total) by mouth daily after supper.     Note:  This document was prepared using Dragon voice recognition software and may include unintentional dictation errors.    Willy Eddy, MD 08/31/20 [place]

## 2020-08-30 NOTE — ED Provider Notes (Incomplete)
[facility] Emergency Department Provider Note    Event Date/Time   First MD Initiated Contact with Patient 08/30/20 2300     (approximate)  I have reviewed the triage vital signs and the nursing notes.   HISTORY  Chief Complaint Flank Pain    HPI Brandon Mayer is a 25 y.o. male presents to the ER for evaluation of right flank pain associated with hematuria.  Symptoms started a day or 2 ago states is mild at that time.  Felt like he could fight through the weekend but then this evening around 6:00 had sudden more severe pain associate with some nausea.  No fevers.  No history of kidney stones.    Past Medical History:  Diagnosis Date  . Bee sting allergy    Family History  Problem Relation Age of Onset  . Gestational diabetes Mother   . Heart murmur Mother    Past Surgical History:  Procedure Laterality Date  . MYRINGOTOMY     Patient Active Problem List   Diagnosis Date Noted  . Viral URI with cough 03/24/2020  . Panic attack 07/17/2019  . Chest wall pain 07/17/2019  . Liver lesion, left lobe 05/28/2018  . Abdominal pain 05/17/2018  . Nausea 05/17/2018  . Routine general medical examination at a health care facility 01/17/2018  . Bee sting allergy 12/09/2010      Prior to Admission medications   Medication Sig Start Date End Date Taking? Authorizing Provider  benzonatate (TESSALON) 200 MG capsule Take 1 capsule (200 mg total) by mouth 3 (three) times daily as needed for cough. Swallow whole, do not bite pill 03/26/20   Tower, Audrie Gallus, MD  hydrOXYzine (ATARAX/VISTARIL) 25 MG tablet Take 1 tablet (25 mg total) by mouth daily as needed for anxiety (for panic attack). Caution of sedation 07/17/19   Tower, Audrie Gallus, MD    Allergies Patient has no known allergies.    Social History Social History   Tobacco Use  . Smoking status: Never Smoker  . Smokeless tobacco: Never Used  Substance Use Topics  . Alcohol use: No  . Drug use: No     Review of Systems Patient denies headaches, rhinorrhea, blurry vision, numbness, shortness of breath, chest pain, edema, cough, abdominal pain, nausea, vomiting, diarrhea, dysuria, fevers, rashes or hallucinations unless otherwise stated above in HPI. ____________________________________________   PHYSICAL EXAM:  VITAL SIGNS: Vitals:   08/30/20 2108  BP: (!) 160/100  Pulse: (!) 108  Resp: 20  Temp: 98.8 F (37.1 C)  SpO2: 100%    Constitutional: Alert and oriented.  Eyes: Conjunctivae are normal.  Head: Atraumatic. Nose: No congestion/rhinnorhea. Mouth/Throat: Mucous membranes are moist.   Neck: No stridor. Painless ROM.  Cardiovascular: Normal rate, regular rhythm. Grossly normal heart sounds.  Good peripheral circulation. Respiratory: Normal respiratory effort.  No retractions. Lungs CTAB. Gastrointestinal: Soft and nontender. No distention. No abdominal bruits. No CVA tenderness. Genitourinary:  Musculoskeletal: No lower extremity tenderness nor edema.  No joint effusions. Neurologic:  Normal speech and language. No gross focal neurologic deficits are appreciated. No facial droop Skin:  Skin is warm, dry and intact. No rash noted. Psychiatric: Mood and affect are normal. Speech and behavior are normal.  ____________________________________________   LABS (all labs ordered are listed, but only abnormal results are displayed)  Results for orders placed or performed during the hospital encounter of 08/30/20 (from the past 24 hour(s))  Lipase, blood     Status: None   Collection  Time: 08/30/20  9:06 PM  Result Value Ref Range   Lipase 40 11 - 51 U/L  Comprehensive metabolic panel     Status: Abnormal   Collection Time: 08/30/20  9:06 PM  Result Value Ref Range   Sodium 139 135 - 145 mmol/L   Potassium 3.3 (L) 3.5 - 5.1 mmol/L   Chloride 102 98 - 111 mmol/L   CO2 26 22 - 32 mmol/L   Glucose, Bld 119 (H) 70 - 99 mg/dL   BUN 8 6 - 20 mg/dL   Creatinine, Ser 1.66  0.61 - 1.24 mg/dL   Calcium 9.3 8.9 - 06.3 mg/dL   Total Protein 7.8 6.5 - 8.1 g/dL   Albumin 4.8 3.5 - 5.0 g/dL   AST 25 15 - 41 U/L   ALT 36 0 - 44 U/L   Alkaline Phosphatase 54 38 - 126 U/L   Total Bilirubin 0.7 0.3 - 1.2 mg/dL   GFR, Estimated >01 >60 mL/min   Anion gap 11 5 - 15  CBC     Status: None   Collection Time: 08/30/20  9:06 PM  Result Value Ref Range   WBC 9.8 4.0 - 10.5 K/uL   RBC 5.68 4.22 - 5.81 MIL/uL   Hemoglobin 16.4 13.0 - 17.0 g/dL   HCT 10.9 32.3 - 55.7 %   MCV 84.9 80.0 - 100.0 fL   MCH 28.9 26.0 - 34.0 pg   MCHC 34.0 30.0 - 36.0 g/dL   RDW 32.2 02.5 - 42.7 %   Platelets 323 150 - 400 K/uL   nRBC 0.0 0.0 - 0.2 %  Urinalysis, Complete w Microscopic     Status: Abnormal   Collection Time: 08/30/20  9:11 PM  Result Value Ref Range   Color, Urine YELLOW (A) YELLOW   APPearance CLEAR (A) CLEAR   Specific Gravity, Urine 1.017 1.005 - 1.030   pH 6.0 5.0 - 8.0   Glucose, UA NEGATIVE NEGATIVE mg/dL   Hgb urine dipstick MODERATE (A) NEGATIVE   Bilirubin Urine NEGATIVE NEGATIVE   Ketones, ur NEGATIVE NEGATIVE mg/dL   Protein, ur NEGATIVE NEGATIVE mg/dL   Nitrite NEGATIVE NEGATIVE   Leukocytes,Ua NEGATIVE NEGATIVE   RBC / HPF 21-50 0 - 5 RBC/hpf   WBC, UA 0-5 0 - 5 WBC/hpf   Bacteria, UA RARE (A) NONE SEEN   Squamous Epithelial / LPF 0-5 0 - 5   Mucus PRESENT    ____________________________________________  ____________________________________________  RADIOLOGY  I personally reviewed all radiographic images ordered to evaluate for the above acute complaints and reviewed radiology reports and findings.  These findings were personally discussed with the patient.  Please see medical record for radiology report.  ____________________________________________   PROCEDURES  Procedure(s) performed:  Procedures    Critical Care performed: ***no ____________________________________________   INITIAL IMPRESSION / ASSESSMENT AND PLAN / ED  COURSE  Pertinent labs & imaging results that were available during my care of the patient were reviewed by me and considered in my medical decision making (see chart for details).   DDX: ***  Brandon Mayer is a 25 y.o. who presents to the ED with ***     The patient was evaluated in Emergency Department today for the symptoms described in the history of present illness. He/she was evaluated in the context of the global COVID-19 pandemic, which necessitated consideration that the patient might be at risk for infection with the SARS-CoV-2 virus that causes COVID-19. Institutional protocols and algorithms that pertain to  the evaluation of patients at risk for COVID-19 are in a state of rapid change based on information released by regulatory bodies including the CDC and federal and state organizations. These policies and algorithms were followed during the patient's care in the ED.  As part of my medical decision making, I reviewed the following data within the electronic MEDICAL RECORD NUMBER Nursing notes reviewed and incorporated, Labs reviewed, notes from prior ED visits and [place] Controlled Substance Database   ____________________________________________   FINAL CLINICAL IMPRESSION(S) / ED DIAGNOSES  Final diagnoses:  None      NEW MEDICATIONS STARTED DURING THIS VISIT:  New Prescriptions   No medications on file     Note:  This document was prepared using Dragon voice recognition software and may include unintentional dictation errors.

## 2020-08-30 NOTE — ED Triage Notes (Signed)
Pt c/o right lower back pain x2 days that has radiated into rt lower abdomen and groin and intensified over the last 24 hours. Pt also c/o hematuria and frequency.

## 2020-08-31 ENCOUNTER — Ambulatory Visit
Admission: RE | Admit: 2020-08-31 | Discharge: 2020-08-31 | Disposition: A | Discharge status: Home / Self Care | Payer: PRIVATE HEALTH INSURANCE | Source: Ambulatory Visit | Attending: Urology | Admitting: Urology

## 2020-08-31 ENCOUNTER — Ambulatory Visit (INDEPENDENT_AMBULATORY_CARE_PROVIDER_SITE_OTHER): Payer: PRIVATE HEALTH INSURANCE | Admitting: Urology

## 2020-08-31 ENCOUNTER — Encounter: Payer: Self-pay | Admitting: Urology

## 2020-08-31 ENCOUNTER — Other Ambulatory Visit: Payer: Self-pay

## 2020-08-31 ENCOUNTER — Ambulatory Visit
Admission: RE | Admit: 2020-08-31 | Discharge: 2020-08-31 | Disposition: A | Discharge status: Home / Self Care | Payer: PRIVATE HEALTH INSURANCE | Source: Home / Self Care | Attending: Urology | Admitting: Urology

## 2020-08-31 ENCOUNTER — Other Ambulatory Visit: Payer: Self-pay | Admitting: *Deleted

## 2020-08-31 VITALS — BP 130/85 | HR 99 | Ht 71.0 in | Wt 281.0 lb

## 2020-08-31 DIAGNOSIS — N201 Calculus of ureter: Secondary | ICD-10-CM | POA: Diagnosis not present

## 2020-08-31 DIAGNOSIS — N200 Calculus of kidney: Secondary | ICD-10-CM

## 2020-08-31 MED ORDER — ONDANSETRON 4 MG PO TBDP: 4.0000 mg | ORAL_TABLET | Freq: Three times a day (TID) | ORAL | 0 refills | Status: DC | PRN

## 2020-08-31 MED ORDER — TAMSULOSIN HCL 0.4 MG PO CAPS: 0.4000 mg | ORAL_CAPSULE | Freq: Every day | ORAL | 0 refills | Status: DC

## 2020-08-31 MED ORDER — OXYCODONE-ACETAMINOPHEN 5-325 MG PO TABS: 1.0000 | ORAL_TABLET | ORAL | 0 refills | Status: DC | PRN

## 2020-08-31 MED ORDER — OXYCODONE-ACETAMINOPHEN 5-325 MG PO TABS: 1.0000 | ORAL_TABLET | Freq: Four times a day (QID) | ORAL | 0 refills | Status: DC | PRN

## 2020-08-31 NOTE — Patient Instructions (Signed)
Kidney Stones  Kidney stones are solid, rock-like deposits that form inside of the kidneys. The kidneys are a pair of organs that make urine. A kidney stone may form in a kidney and move into other parts of the urinary tract, including the tubes that connect the kidneys to the bladder (ureters), the bladder, and the tube that carries urine out of the body (urethra). As the stone moves through these areas, it can cause intense pain and block the flow of urine. Kidney stones are created when high levels of certain minerals are found in the urine. The stones are usually passed out of the body through urination, but in some cases, medical treatment may be needed to remove them. What are the causes? Kidney stones may be caused by:  A condition in which certain glands produce too much parathyroid hormone (primary hyperparathyroidism), which causes too much calcium buildup in the blood.  A buildup of uric acid crystals in the bladder (hyperuricosuria). Uric acid is a chemical that the body produces when you eat certain foods. It usually exits the body in the urine.  Narrowing (stricture) of one or both of the ureters.  A kidney blockage that is present at birth (congenital obstruction).  Past surgery on the kidney or the ureters, such as gastric bypass surgery. What increases the risk? The following factors may make you more likely to develop this condition:  Having had a kidney stone in the past.  Having a family history of kidney stones.  Not drinking enough water.  Eating a diet that is high in protein, salt (sodium), or sugar.  Being overweight or obese. What are the signs or symptoms? Symptoms of a kidney stone may include:  Pain in the side of the abdomen, right below the ribs (flank pain). Pain usually spreads (radiates) to the groin.  Needing to urinate frequently or urgently.  Painful urination.  Blood in the urine (hematuria).  Nausea.  Vomiting.  Fever and chills. How  is this diagnosed? This condition may be diagnosed based on:  Your symptoms and medical history.  A physical exam.  Blood tests.  Urine tests. These may be done before and after the stone passes out of your body through urination.  Imaging tests, such as a CT scan, abdominal X-ray, or ultrasound.  A procedure to examine the inside of the bladder (cystoscopy). How is this treated? Treatment for kidney stones depends on the size, location, and makeup of the stones. Kidney stones will often pass out of the body through urination. You may need to:  Increase your fluid intake to help pass the stone. In some cases, you may be given fluids through an IV and may need to be monitored at the hospital.  Take medicine for pain.  Make changes in your diet to help prevent kidney stones from coming back. Sometimes, medical procedures are needed to remove a kidney stone. This may involve:  A procedure to break up kidney stones using: ? A focused beam of light (laser therapy). ? Shock waves (extracorporeal shock wave lithotripsy).  Surgery to remove kidney stones. This may be needed if you have severe pain or have stones that block your urinary tract. Follow these instructions at home: Medicines  Take over-the-counter and prescription medicines only as told by your health care provider.  Ask your health care provider if the medicine prescribed to you requires you to avoid driving or using heavy machinery. Eating and drinking  Drink enough fluid to keep your urine pale yellow.   You may be instructed to drink at least 8-10 glasses of water each day. This will help you pass the kidney stone.  If directed, change your diet. This may include: ? Limiting how much sodium you eat. ? Eating more fruits and vegetables. ? Limiting how much animal protein--such as red meat, poultry, fish, and eggs--you eat.  Follow instructions from your health care provider about eating or drinking  restrictions. General instructions  Collect urine samples as told by your health care provider. You may need to collect a urine sample: ? 24 hours after you pass the stone. ? 8-12 weeks after passing the kidney stone, and every 6-12 months after that.  Strain your urine every time you urinate, for as long as directed. Use the strainer that your health care provider recommends.  Do not throw out the kidney stone after passing it. Keep the stone so it can be tested by your health care provider. Testing the makeup of your kidney stone may help prevent you from getting kidney stones in the future.  Keep all follow-up visits as told by your health care provider. This is important. You may need follow-up X-rays or ultrasounds to make sure that your stone has passed. How is this prevented? To prevent another kidney stone:  Drink enough fluid to keep your urine pale yellow. This is the best way to prevent kidney stones.  Eat a healthy diet and follow recommendations from your health care provider about foods to avoid. You may be instructed to eat a low-protein diet. Recommendations vary depending on the type of kidney stone that you have.  Maintain a healthy weight.   Where to find more information  National Kidney Foundation (NKF): www.kidney.org  [facility]): www.urologyhealth.org Contact a health care provider if:  You have pain that gets worse or does not get better with medicine. Get help right away if:  You have a fever or chills.  You develop severe pain.  You develop new abdominal pain.  You faint.  You are unable to urinate. Summary  Kidney stones are solid, rock-like deposits that form inside of the kidneys.  Kidney stones can cause nausea, vomiting, blood in the urine, abdominal pain, and the urge to urinate frequently.  Treatment for kidney stones depends on the size, location, and makeup of the stones. Kidney stones will often pass out of the body  through urination.  Kidney stones can be prevented by drinking enough fluids, eating a healthy diet, and maintaining a healthy weight. This information is not intended to replace advice given to you by your health care provider. Make sure you discuss any questions you have with your health care provider. Document Revised: 08/27/2018 Document Reviewed: 08/27/2018 Elsevier Patient Education  2021 Else[employer] Inc.   Laser Therapy for Kidney Stones Laser therapy for kidney stones is a procedure to break up small, hard mineral deposits that form in the kidney (kidney stones). The procedure is done using a device that produces a focused beam of light (laser). The laser breaks up kidney stones into pieces that are small enough to be passed out of the body through urination or removed from the body during the procedure. You may need laser therapy if you have kidney stones that are painful or block your urinary tract. This procedure is done by inserting a tube (ureteroscope) into your kidney through the urethral opening. The urethra is the part of the body that drains urine from the bladder. In women, the urethra opens above the  vaginal opening. In men, the urethra opens at the tip of the penis. The ureteroscope is inserted through the urethra, and surgical instruments are moved through the bladder and the muscular tube that connects the kidney to the bladder (ureter) until they reach the kidney. Tell a health care provider about:  Any allergies you have.  All medicines you are taking, including vitamins, herbs, eye drops, creams, and over-the-counter medicines.  Any problems you or family members have had with anesthetic medicines.  Any blood disorders you have.  Any surgeries you have had.  Any medical conditions you have.  Whether you are pregnant or may be pregnant. What are the risks? Generally, this is a safe procedure. However, problems may occur,  including:  Infection.  Bleeding.  Allergic reactions to medicines.  Damage to the urethra, bladder, or ureter.  Urinary tract infection (UTI).  Narrowing of the urethra (urethral stricture).  Difficulty passing urine.  Blockage of the kidney caused by a fragment of kidney stone. What happens before the procedure? Medicines  Ask your health care provider about: ? Changing or stopping your regular medicines. This is especially important if you are taking diabetes medicines or blood thinners. ? Taking medicines such as aspirin and ibuprofen. These medicines can thin your blood. Do not take these medicines unless your health care provider tells you to take them. ? Taking over-the-counter medicines, vitamins, herbs, and supplements. Eating and drinking Follow instructions from your health care provider about eating and drinking, which may include:  8 hours before the procedure - stop eating heavy meals or foods, such as meat, fried foods, or fatty foods.  6 hours before the procedure - stop eating light meals or foods, such as toast or cereal.  6 hours before the procedure - stop drinking milk or drinks that contain milk.  2 hours before the procedure - stop drinking clear liquids. Staying hydrated Follow instructions from your health care provider about hydration, which may include:  Up to 2 hours before the procedure - you may continue to drink clear liquids, such as water, clear fruit juice, black coffee, and plain tea.   General instructions  You may have a physical exam before the procedure. You may also have tests, such as imaging tests and blood or urine tests.  If your ureter is too narrow, your health care provider may place a soft, flexible tube (stent) inside of it. The stent may be placed days or weeks before your laser therapy procedure.  Plan to have someone take you home from the hospital or clinic.  If you will be going home right after the procedure, plan to  have someone stay with you for 24 hours.  Do not use any products that contain nicotine or tobacco for at least 4 weeks before the procedure. These products include cigarettes, e-cigarettes, and chewing tobacco. If you need help quitting, ask your health care provider.  Ask your health care provider: ? How your surgical site will be marked or identified. ? What steps will be taken to help prevent infection. These may include:  Removing hair at the surgery site.  Washing skin with a germ-killing soap.  Taking antibiotic medicine. What happens during the procedure?  An IV will be inserted into one of your veins.  You will be given one or more of the following: ? A medicine to help you relax (sedative). ? A medicine to numb the area (local anesthetic). ? A medicine to make you fall asleep (general anesthetic).  A ureteroscope will be inserted into your urethra. The ureteroscope will send images to a video screen in the operating room to guide your surgeon to the area of your kidney that will be treated.  A small, flexible tube will be threaded through the ureteroscope and into your bladder and ureter, up to your kidney.  The laser device will be inserted into your kidney through the tube. Your surgeon will pulse the laser on and off to break up kidney stones.  A surgical instrument that has a tiny wire basket may be inserted through the tube into your kidney to remove the pieces of broken kidney stone. The procedure may vary among health care providers and hospitals.   What happens after the procedure?  Your blood pressure, heart rate, breathing rate, and blood oxygen level will be monitored until you leave the hospital or clinic.  You will be given pain medicine as needed.  You may continue to receive antibiotics.  You may have a stent temporarily placed in your ureter.  Do not drive for 24 hours if you were given a sedative during your procedure.  You may be given a strainer  to collect any stone fragments that you pass in your urine. Your health care provider may have these tested. Summary  Laser therapy for kidney stones is a procedure to break up kidney stones into pieces that are small enough to be passed out of the body through urination or removed during the procedure.  Follow instructions from your health care provider about eating and drinking before the procedure.  During the procedure, the ureteroscope will send images to a video screen to guide your surgeon to the area of your kidney that will be treated.  Do not drive for 24 hours if you were given a sedative during your procedure. This information is not intended to replace advice given to you by your health care provider. Make sure you discuss any questions you have with your health care provider. Document Revised: 12/20/2017 Document Reviewed: 12/20/2017 Elsevier Patient Education  2021 [employer] Inc.   Ureteral Stent Implantation  Ureteral stent implantation is a procedure to insert (implant) a flexible, soft, plastic tube (stent) into a ureter. Ureters are the tube-like parts of the body that drain urine from the kidneys. The stent supports the ureter while it heals and helps to drain urine. You may have a ureteral stent implanted after having a procedure to remove a blockage from the ureter (ureterolysis or pyeloplasty). You may also have a stent implanted to open the flow of urine when you have a blockage caused by a kidney stone, tumor, blood clot, or infection. You have two ureters, one on each side of the body. The ureters connect the kidneys to the organ that holds urine until it passes out of the body (bladder). The stent is placed so that one end is in the kidney, and one end is in the bladder. The stent is usually taken out after your ureter has healed. Depending on your condition, you may have a stent for just a few weeks, or you may have a long-term stent that will need to be replaced every  few months. Tell a health care provider about:  Any allergies you have.  All medicines you are taking, including vitamins, herbs, eye drops, creams, and over-the-counter medicines.  Any problems you or family members have had with anesthetic medicines.  Any blood disorders you have.  Any surgeries you have had.  Any medical conditions you  have.  Whether you are pregnant or may be pregnant. What are the risks? Generally, this is a safe procedure. However, problems may occur, including:  Infection.  Bleeding.  Allergic reactions to medicines.  Damage to other structures or organs. Tearing (perforation) of the ureter is possible.  Movement of the stent away from where it is placed during surgery (migration). What happens before the procedure? Medicines Ask your health care provider about:  Changing or stopping your regular medicines. This is especially important if you are taking diabetes medicines or blood thinners.  Taking medicines such as aspirin and ibuprofen. These medicines can thin your blood. Do not take these medicines unless your health care provider tells you to take them.  Taking over-the-counter medicines, vitamins, herbs, and supplements. Eating and drinking Follow instructions from your health care provider about eating and drinking, which may include:  8 hours before the procedure - stop eating heavy meals or foods, such as meat, fried foods, or fatty foods.  6 hours before the procedure - stop eating light meals or foods, such as toast or cereal.  6 hours before the procedure - stop drinking milk or drinks that contain milk.  2 hours before the procedure - stop drinking clear liquids. Staying hydrated Follow instructions from your health care provider about hydration, which may include:  Up to 2 hours before the procedure - you may continue to drink clear liquids, such as water, clear fruit juice, black coffee, and plain tea. General  instructions  Do not drink alcohol.  Do not use any products that contain nicotine or tobacco for at least 4 weeks before the procedure. These products include cigarettes, e-cigarettes, and chewing tobacco. If you need help quitting, ask your health care provider.  You may have an exam or testing, such as imaging or blood tests.  Ask your health care provider what steps will be taken to help prevent infection. These may include: ? Removing hair at the surgery site. ? Washing skin with a germ-killing soap. ? Taking antibiotic medicine.  Plan to have someone take you home from the hospital or clinic.  If you will be going home right after the procedure, plan to have someone with you for 24 hours. What happens during the procedure?  An IV will be inserted into one of your veins.  You may be given a medicine to help you relax (sedative).  You may be given a medicine to make you fall asleep (general anesthetic).  A thin, tube-shaped instrument with a light and tiny camera at the end (cystoscope) will be inserted into your urethra. The urethra is the tube that drains urine from the bladder out of the body. In men, the urethra opens at the end of the penis. In women, the urethra opens in front of the vaginal opening.  The cystoscope will be passed into your bladder.  A thin wire (guide wire) will be passed through your bladder and into your ureter. This is used to guide the stent into your ureter.  The stent will be inserted into your ureter.  The guide wire and the cystoscope will be removed.  A flexible tube (catheter) may be inserted through your urethra so that one end is in your bladder. This helps to drain urine from your bladder. The procedure may vary among hospitals and health care providers. What happens after the procedure?  Your blood pressure, heart rate, breathing rate, and blood oxygen level will be monitored until you leave the hospital or clinic.  You may continue to  receive medicine and fluids through an IV.  You may have some soreness or pain in your abdomen and urethra. Medicines will be available to help you.  You will be encouraged to get up and walk around as soon as you can.  You may have a catheter draining your urine.  You will have some blood in your urine.  Do not drive for 24 hours if you were given a sedative during your procedure. Summary  Ureteral stent implantation is a procedure to insert a flexible, soft, plastic tube (stent) into a ureter.  You may have a stent implanted to support the ureter while it heals after a procedure or to open the flow of urine if there is a blockage.  Follow instructions from your health care provider about taking medicines and about eating and drinking before the procedure.  Depending on your condition, you may have a stent for just a few weeks, or you may have a long-term stent that will need to be replaced every few months. This information is not intended to replace advice given to you by your health care provider. Make sure you discuss any questions you have with your health care provider. Document Revised: 01/15/2018 Document Reviewed: 01/16/2018 Elsevier Patient Education  2021 [employer].

## 2020-08-31 NOTE — Progress Notes (Signed)
08/31/20 1:03 PM   Brandon Mayer Feb 05, 1996 [id number]  CC: Right ureteral stone  HPI: I saw Brandon Mayer and his girlfriend in urology clinic today for evaluation of a right ureteral stone.  He is a 25 year old male with obesity and BMI of 39 he reports 1 week of intermittent right-sided flank pain and gross hematuria.  He had severe pain overnight necessitating ED visit, and CT showed a 99mmx2mm right proximal ureteral stone with upstream hydronephrosis.  Urinalysis showed microscopic hematuria but was otherwise benign, and other labs were benign, and he was discharged with medical expulsive therapy and close urology follow-up.  He has also had some nausea responsive to Zofran.  He denies any fevers, chills, or UTI symptoms.  He denies any prior stone episodes.  Pain is currently well controlled on Percocet.   PMH: Past Medical History:  Diagnosis Date  . Bee sting allergy     Surgical History: Past Surgical History:  Procedure Laterality Date  . MYRINGOTOMY      Family History: Family History  Problem Relation Age of Onset  . Gestational diabetes Mother   . Heart murmur Mother     Social History:  reports that he has never smoked. He has never used smokeless tobacco. He reports that he does not drink alcohol and does not use drugs.  Physical Exam: BP 130/85   Pulse 99   Ht 5\' 11"  (1.803 m)   Wt 281 lb (127.5 kg)   BMI 39.19 kg/m    Constitutional:  Alert and oriented, No acute distress. Cardiovascular: No clubbing, cyanosis, or edema. Respiratory: Normal respiratory effort, no increased work of breathing. GI: Abdomen is soft, nontender, nondistended, no abdominal masses  Laboratory Data: Reviewed, see HPI  Pertinent Imaging: I have personally viewed and interpreted the CT showing a 2 mm x 6 mm right proximal ureteral stone with upstream hydronephrosis.  I reviewed his KUB today and stone is not clearly visible, may be uric acid based on a low density on  CT.  Assessment & Plan:   25 year old male with 6 mm x 2 mm right proximal ureteral stone and no evidence of infection.  Pain currently well controlled on narcotic pain medications.  We discussed various treatment options for urolithiasis including observation with or without medical expulsive therapy, shockwave lithotripsy (SWL), ureteroscopy and laser lithotripsy with stent placement, and percutaneous nephrolithotomy.  We discussed that management is based on stone size, location, density, patient co-morbidities, and patient preference.   Stones <21mm in size have a >80% spontaneous passage rate. Data surrounding the use of tamsulosin for medical expulsive therapy is controversial, but meta analyses suggests it is most efficacious for distal stones between 5-50mm in size. Possible side effects include dizziness/lightheadedness, and retrograde ejaculation.  SWL has a lower stone free rate in a single procedure, but also a lower complication rate compared to ureteroscopy and avoids a stent and associated stent related symptoms. Possible complications include renal hematoma, steinstrasse, and need for additional treatment.  Stone not visible on KUB and with his obesity would not be a good candidate for shockwave lithotripsy.  Ureteroscopy with laser lithotripsy and stent placement has a higher stone free rate than SWL in a single procedure, however increased complication rate including possible infection, ureteral injury, bleeding, and stent related morbidity. Common stent related symptoms include dysuria, urgency/frequency, and flank pain.  -After an extensive discussion of the risks and benefits of the above treatment options, the patient would like to proceed with trial of  medical expulsive therapy. -Return precautions discussed extensively including uncontrolled pain, nausea/vomiting, or fever -RTC next week for virtual visit symptom check, consider adding for ureteroscopy at that time if  continuing to have pain  Legrand Rams, MD 08/31/2020  [facility] [address], [address] [place], [place] [address] [phone]

## 2020-09-01 ENCOUNTER — Ambulatory Visit: Payer: Self-pay | Admitting: Family Medicine

## 2020-09-03 ENCOUNTER — Other Ambulatory Visit: Payer: Self-pay

## 2020-09-07 ENCOUNTER — Telehealth: Payer: PRIVATE HEALTH INSURANCE | Admitting: Urology

## 2021-07-19 ENCOUNTER — Encounter: Payer: Self-pay | Admitting: Family Medicine

## 2021-07-19 ENCOUNTER — Other Ambulatory Visit: Payer: Self-pay

## 2021-07-19 ENCOUNTER — Ambulatory Visit (INDEPENDENT_AMBULATORY_CARE_PROVIDER_SITE_OTHER): Payer: 59 | Admitting: Family Medicine

## 2021-07-19 VITALS — BP 121/80 | HR 101 | Temp 98.2°F | Ht 71.0 in | Wt 273.2 lb

## 2021-07-19 DIAGNOSIS — R430 Anosmia: Secondary | ICD-10-CM | POA: Diagnosis not present

## 2021-07-19 DIAGNOSIS — Z87442 Personal history of urinary calculi: Secondary | ICD-10-CM

## 2021-07-19 DIAGNOSIS — Z91030 Bee allergy status: Secondary | ICD-10-CM | POA: Diagnosis not present

## 2021-07-19 MED ORDER — EPINEPHRINE 0.3 MG/0.3ML IJ SOAJ: 0.3000 mg | INTRAMUSCULAR | 3 refills | Status: AC | PRN

## 2021-07-19 NOTE — Progress Notes (Signed)
? ?Subjective:  ? ? Patient ID: Brandon Mayer, male    DOB: [birth year], 26 y.o.   MRN: [id number] ? ?This visit occurred during the SARS-CoV-2 public health emergency.  Safety protocols were in place, including screening questions prior to the visit, additional usage of staff PPE, and extensive cleaning of exam room while observing appropriate contact time as indicated for disinfecting solutions.  ? ?HPI ?Pt presents for f/u of bee sting allergy, needs epi pen px  ? ?Wt Readings from Last 3 Encounters:  ?07/19/21 273 lb 4 oz (123.9 kg)  ?08/31/20 281 lb (127.5 kg)  ?08/30/20 265 lb (120.2 kg)  ? ?38.11 kg/m? ? ?In school  ?For sports and exercise science  ?Wants to be a physical therapist  ?Transferring to [employer] - planning  ? ?May have missed flu shot this fall ?Had covid 3 times- got it early and he lost smell pretty much permanently  ?No vaccine  ? ? ?Taking care of himself - goes to the gym/sometimes twice a day ? ?H/o bee sting allergy  ?Has not been stung recently but last time his face swelled and he started to swell in hi throat  ?Has self admin  ?Needs a refill of epi pen ?[place]- sees bees [place] in [place] [employer] [place]  ? ? ?Last visit in 2001 px hydroxyzine  ? ?Has been seen by urology for kidney stones  ?Doing much better/passed it  ?He keeps his fluids up  ? ?BP Readings from Last 3 Encounters:  ?07/19/21 136/90  ?08/31/20 130/85  ?08/31/20 (!) 123/91  ? ?Pulse Readings from Last 3 Encounters:  ?07/19/21 (!) 101  ?08/31/20 99  ?08/31/20 89  ? ?Does not need STD screening  ? ?Past anxiety  ?Doing better  ?Exercise helps ?New job helps  ? ?Patient Active Problem List  ? Diagnosis Date Noted  ? Routine general medical examination at a health care facility 01/17/2018  ? Bee sting allergy 12/09/2010  ? ?Past Medical History:  ?Diagnosis Date  ? Bee sting allergy   ? ?Past Surgical History:  ?Procedure Laterality Date  ? MYRINGOTOMY    ? ?Social History  ? ?Tobacco Use  ? Smoking  status: Never  ? Smokeless tobacco: Never  ?Substance Use Topics  ? Alcohol use: No  ? Drug use: No  ? ?Family History  ?Problem Relation Age of Onset  ? Gestational diabetes Mother   ? Heart murmur Mother   ? ?Allergies  ?Allergen Reactions  ? Bee Venom   ? ?No current outpatient medications on file prior to visit.  ? ?No current facility-administered medications on file prior to visit.  ?  ?Review of Systems  ?Constitutional:  Negative for activity change, appetite change, fatigue, fever and unexpected weight change.  ?HENT:  Negative for congestion, rhinorrhea, sore throat and trouble swallowing.   ?Eyes:  Negative for pain, redness, itching and visual disturbance.  ?Respiratory:  Negative for cough, chest tightness, shortness of breath and wheezing.   ?Cardiovascular:  Negative for chest pain and palpitations.  ?Gastrointestinal:  Negative for abdominal pain, blood in stool, constipation, diarrhea and nausea.  ?Endocrine: Negative for cold intolerance, heat intolerance, polydipsia and polyuria.  ?Genitourinary:  Negative for difficulty urinating, dysuria, frequency and urgency.  ?Musculoskeletal:  Negative for arthralgias, joint swelling and myalgias.  ?Skin:  Negative for pallor and rash.  ?Neurological:  Negative for dizziness, tremors, weakness, numbness and headaches.  ?Hematological:  Negative for adenopathy. Does not bruise/bleed easily.  ?  Psychiatric/Behavioral:  Negative for decreased concentration and dysphoric mood. The patient is not nervous/anxious.   ? ?   ?Objective:  ? Physical Exam ?Constitutional:   ?   General: He is not in acute distress. ?   Appearance: Normal appearance. He is obese. He is not ill-appearing or diaphoretic.  ?HENT:  ?   Head: Normocephalic and atraumatic.  ?   Nose: Nose normal.  ?   Mouth/Throat:  ?   Mouth: Mucous membranes are moist.  ?   Pharynx: Oropharynx is clear. No posterior oropharyngeal erythema.  ?Eyes:  ?   General:     ?   Right eye: No discharge.     ?   Left  eye: No discharge.  ?   Conjunctiva/sclera: Conjunctivae normal.  ?   Pupils: Pupils are equal, round, and reactive to light.  ?Neck:  ?   Vascular: No carotid bruit.  ?Cardiovascular:  ?   Rate and Rhythm: Regular rhythm. Tachycardia present.  ?   Heart sounds: Normal heart sounds.  ?   Comments: Pulse is up, came straight from the gym ?Pulmonary:  ?   Effort: Pulmonary effort is normal. No respiratory distress.  ?   Breath sounds: Normal breath sounds. No stridor. No wheezing, rhonchi or rales.  ?   Comments: No wheeze even on forced expiration  ?Musculoskeletal:  ?   Cervical back: Normal range of motion and neck supple. No tenderness.  ?   Right lower leg: No edema.  ?   Left lower leg: No edema.  ?Lymphadenopathy:  ?   Cervical: No cervical adenopathy.  ?Skin: ?   Findings: No erythema or rash.  ?Neurological:  ?   Mental Status: He is alert.  ?   Cranial Nerves: No cranial nerve deficit.  ?Psychiatric:     ?   Mood and Affect: Mood normal.  ? ? ? ? ? ?   ?Assessment & Plan:  ? ?Problem List Items Addressed This Visit   ? ?  ? Other  ? Anosmia  ?  Pt has had this since first bout of covid  ?Not very bothersome to him  ?Does not affect eating  ?  ?  ? Bee sting allergy - Primary  ?  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 ?Refill done ?Poss side eff reviewed ?  ?  ? History of kidney stones  ?  Passed renal stone last spring ?Doing better - no recurrence  ? ?Has seen urology  ?Encouraged to keep up a good fluid intake  ?  ?  ?  ?

## 2021-07-19 NOTE — Assessment & Plan Note (Signed)
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 ?Refill done ?Poss side eff reviewed ?

## 2021-07-19 NOTE — Assessment & Plan Note (Signed)
Pt has had this since first bout of covid  ?Not very bothersome to him  ?Does not affect eating  ?

## 2021-07-19 NOTE — Assessment & Plan Note (Signed)
Passed renal stone last spring ?Doing better - no recurrence  ? ?Has seen urology  ?Encouraged to keep up a good fluid intake  ?

## 2021-07-19 NOTE — Patient Instructions (Signed)
Keep your epi pen on hand  ?Take care of yourself  ? ?Keep up the good work with exercise  ?Get a flu shot in the fall  ?

## 2021-10-18 ENCOUNTER — Telehealth: Payer: Self-pay

## 2021-10-18 NOTE — Telephone Encounter (Signed)
Called pt  inform pt that his record is up front

## 2021-10-24 ENCOUNTER — Telehealth: Payer: Self-pay

## 2021-10-24 NOTE — Telephone Encounter (Signed)
Patient is scheduled   

## 2021-10-24 NOTE — Telephone Encounter (Signed)
Patient is calling in stating for college purposes they need him to get some vaccinations, such as - Tetnus,Diphtheria,Polio,measles/mums,rubella and Hep B.  Please advise on how to schedule. Patient is needing this as soon as possible or else he is having to pay each month for not having these done.

## 2021-11-09 ENCOUNTER — Encounter: Payer: Self-pay | Admitting: Family Medicine

## 2021-11-09 ENCOUNTER — Ambulatory Visit (INDEPENDENT_AMBULATORY_CARE_PROVIDER_SITE_OTHER): Payer: 59 | Admitting: Family Medicine

## 2021-11-09 VITALS — BP 122/80 | HR 91 | Ht 71.0 in | Wt 249.4 lb

## 2021-11-09 DIAGNOSIS — R41840 Attention and concentration deficit: Secondary | ICD-10-CM

## 2021-11-09 DIAGNOSIS — Z23 Encounter for immunization: Secondary | ICD-10-CM

## 2021-11-09 DIAGNOSIS — Z020 Encounter for examination for admission to educational institution: Secondary | ICD-10-CM

## 2021-11-09 NOTE — Patient Instructions (Signed)
Meningitis vaccine today   Keep up the good work with diet and exercise   Use sun protection also   Here is info on the HPV vaccine  I do recommend it  Check and see if your insurance covers it , if so you can get it here or at the health dept or at school

## 2021-11-09 NOTE — Assessment & Plan Note (Signed)
For new college transfer to [employer]  imms updated , meningococcal Varicella   Given info on HPV vaccine to consider and check on coverage   Commended better health habits and wt loss !  Reviewed health habits including diet and exercise and skin cancer prevention Reviewed appropriate screening tests for age  Also reviewed health mt list, fam hx and immunization status , as well as social and family history

## 2021-11-09 NOTE — Progress Notes (Signed)
Subjective:    Patient ID: Brandon Mayer, male    DOB: 12/22/1995, 26 y.o.   MRN: [id number]  HPI Here for health maintenance exam and to review chronic medical problems    Wt Readings from Last 3 Encounters:  11/09/21 249 lb 6.4 oz (113.1 kg)  07/19/21 273 lb 4 oz (123.9 kg)  08/31/20 281 lb (127.5 kg)   34.78 kg/m  Has lost weight  Eating less and better  Working out more also - cardio in the mornings for an hour  Afternoons - alternates free weights   Got away from healthy breakfast foods  Protein shake instead   Lunch is out  Futures trader is salad   Declines need for STD testing     Busy with school and work  Moving to [place] - will start going to [employer]  For exercise science  Will have to find a new job    Immunization History  Administered Date(s) Administered   Influenza Inj Mdck Quad With Preservative 01/30/2018   Meningococcal Conjugate 12/09/2010   Td 06/23/2007   Tdap 01/17/2018    STD screening  Hep C screening   Meningo vaccine 11/2010  Needs to update vaccines for college  Does not have form with him    Flu shots- gets in the fall  Covid vaccination    Family history :  BP Readings from Last 3 Encounters:  11/09/21 122/80  07/19/21 121/80  08/31/20 130/85   Pulse Readings from Last 3 Encounters:  11/09/21 91  07/19/21 (!) 101  08/31/20 99      Last cholesterol Lab Results  Component Value Date   CHOL 157 01/17/2018   HDL 38.90 (L) 01/17/2018   LDLCALC 109 (H) 01/17/2018   TRIG 46.0 01/17/2018   CHOLHDL 4 01/17/2018   Attention/concentration    Patient Active Problem List   Diagnosis Date Noted   Attention disturbance 11/09/2021   School health examination 11/09/2021   History of kidney stones 07/19/2021   Anosmia 07/19/2021   Routine general medical examination at a health care facility 01/17/2018   Bee sting allergy 12/09/2010   Past Medical History:  Diagnosis Date   Bee sting allergy    Past  Surgical History:  Procedure Laterality Date   MYRINGOTOMY     Social History   Tobacco Use   Smoking status: Never   Smokeless tobacco: Never  Vaping Use   Vaping Use: Never used  Substance Use Topics   Alcohol use: No   Drug use: No   Family History  Problem Relation Age of Onset   Gestational diabetes Mother    Heart murmur Mother    Allergies  Allergen Reactions   Bee Venom    Current Outpatient Medications on File Prior to Visit  Medication Sig Dispense Refill   EPINEPHrine 0.3 mg/0.3 mL IJ SOAJ injection Inject 0.3 mg into the muscle as needed for anaphylaxis. 1 each 3   No current facility-administered medications on file prior to visit.    Review of Systems  Constitutional:  Negative for activity change, appetite change, fatigue, fever and unexpected weight change.  HENT:  Negative for congestion, rhinorrhea, sore throat and trouble swallowing.   Eyes:  Negative for pain, redness, itching and visual disturbance.  Respiratory:  Negative for cough, chest tightness, shortness of breath and wheezing.   Cardiovascular:  Negative for chest pain and palpitations.  Gastrointestinal:  Negative for abdominal pain, blood in stool, constipation, diarrhea and nausea.  Endocrine: Negative  for cold intolerance, heat intolerance, polydipsia and polyuria.  Genitourinary:  Negative for difficulty urinating, dysuria, frequency and urgency.  Musculoskeletal:  Negative for arthralgias, joint swelling and myalgias.  Skin:  Negative for pallor and rash.  Neurological:  Negative for dizziness, tremors, weakness, numbness and headaches.  Hematological:  Negative for adenopathy. Does not bruise/bleed easily.  Psychiatric/Behavioral:  Positive for decreased concentration. Negative for agitation, behavioral problems, confusion and dysphoric mood. The patient is not nervous/anxious.        Objective:   Physical Exam Constitutional:      General: He is not in acute distress.     Appearance: Normal appearance. He is well-developed. He is obese. He is not ill-appearing or diaphoretic.     Comments: Wt loss noted   HENT:     Head: Normocephalic and atraumatic.     Right Ear: Tympanic membrane, ear canal and external ear normal.     Left Ear: Tympanic membrane, ear canal and external ear normal.     Nose: Nose normal. No congestion.     Mouth/Throat:     Mouth: Mucous membranes are moist.     Pharynx: Oropharynx is clear. No posterior oropharyngeal erythema.  Eyes:     General: No scleral icterus.       Right eye: No discharge.        Left eye: No discharge.     Conjunctiva/sclera: Conjunctivae normal.     Pupils: Pupils are equal, round, and reactive to light.  Neck:     Thyroid: No thyromegaly.     Vascular: No carotid bruit or JVD.  Cardiovascular:     Rate and Rhythm: Normal rate and regular rhythm.     Pulses: Normal pulses.     Heart sounds: Normal heart sounds.     No gallop.  Pulmonary:     Effort: Pulmonary effort is normal. No respiratory distress.     Breath sounds: Normal breath sounds. No wheezing or rales.     Comments: Good air exch Chest:     Chest wall: No tenderness.  Abdominal:     General: Bowel sounds are normal. There is no distension or abdominal bruit.     Palpations: Abdomen is soft. There is no mass.     Tenderness: There is no abdominal tenderness.     Hernia: No hernia is present.  Musculoskeletal:        General: No tenderness.     Cervical back: Normal range of motion and neck supple. No rigidity. No muscular tenderness.     Right lower leg: No edema.     Left lower leg: No edema.  Lymphadenopathy:     Cervical: No cervical adenopathy.  Skin:    General: Skin is warm and dry.     Coloration: Skin is not pale.     Findings: No erythema or rash.  Neurological:     Mental Status: He is alert.     Cranial Nerves: No cranial nerve deficit.     Motor: No abnormal muscle tone.     Coordination: Coordination normal.      Gait: Gait normal.     Deep Tendon Reflexes: Reflexes are normal and symmetric. Reflexes normal.  Psychiatric:        Attention and Perception: Attention normal.        Mood and Affect: Mood normal.        Cognition and Memory: Cognition and memory normal.     Comments: Nl mood  Assessment & Plan:   Problem List Items Addressed This Visit       Other   Attention disturbance - Primary    Pt is struggling with attention and concentration, making it hard to get through college (in HS he did not care about grades) Is getting ready to start university and would like testing  Does not c/o mood changes or anxiety Will look into options for testing       School health examination    For new college transfer to [employer]  imms updated , meningococcal Varicella   Given info on HPV vaccine to consider and check on coverage   Commended better health habits and wt loss !  Reviewed health habits including diet and exercise and skin cancer prevention Reviewed appropriate screening tests for age  Also reviewed health mt list, fam hx and immunization status , as well as social and family history

## 2021-11-09 NOTE — Telephone Encounter (Signed)
Print out and fill out at pt 's appt.

## 2021-11-09 NOTE — Assessment & Plan Note (Signed)
Pt is struggling with attention and concentration, making it hard to get through college (in HS he did not care about grades) Is getting ready to start university and would like testing  Does not c/o mood changes or anxiety Will look into options for testing

## 2021-12-07 ENCOUNTER — Ambulatory Visit: Payer: Self-pay | Admitting: Physician Assistant

## 2021-12-07 ENCOUNTER — Encounter: Payer: Self-pay | Admitting: Physician Assistant

## 2021-12-07 DIAGNOSIS — T63441A Toxic effect of venom of bees, accidental (unintentional), initial encounter: Secondary | ICD-10-CM

## 2021-12-07 NOTE — Progress Notes (Signed)
   Subjective: Bee sting    Patient ID: Brandon Mayer, male    DOB: Jul 10, 1995, 26 y.o.   MRN: [id number]  HPI Patient presents with bee sting to the right calf which occurred approximately 15 minutes ago.  Patient has an allergy to bee stings and carries EpiPen.  Patient denies any allergic reactions at this time.   Review of Systems Negative except for chief complaint    Objective:   Physical Exam No acute distress Patient BP is 132/94 pulse is 117. HEENT is unremarkable.  Neck is supple for lymphadenopathy or bruits.  Lungs are clear to auscultation.  Heart regular rate and rhythm. Mild erythema but no edema in insect bite site.       Assessment & Plan: Localized reaction insect bite  Patient is released back to work.  Vies return back to clinic or ED if condition worsens.

## 2022-02-22 IMAGING — CT CT RENAL STONE PROTOCOL
2 of 4 series · 16 of 46 positions shown, 18 images · non-contrast
Comparison: CT abdomen pelvis dated 09/03/2009.

CLINICAL DATA: 24-year-old male with right flank pain.

EXAM:
CT ABDOMEN AND PELVIS WITHOUT CONTRAST
TECHNIQUE: Multidetector CT imaging of the abdomen and pelvis was performed
following the standard protocol without IV contrast.

[Series 2: stone full standard · axial · 0.95mm/px · z∈[-762,-287]mm · 13 of 106 slices shown, 15 images]
[im 6/106  soft-tissue]
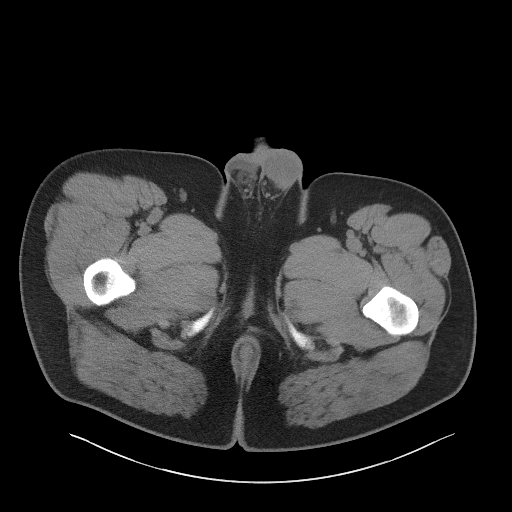
[im 6/106  bone]
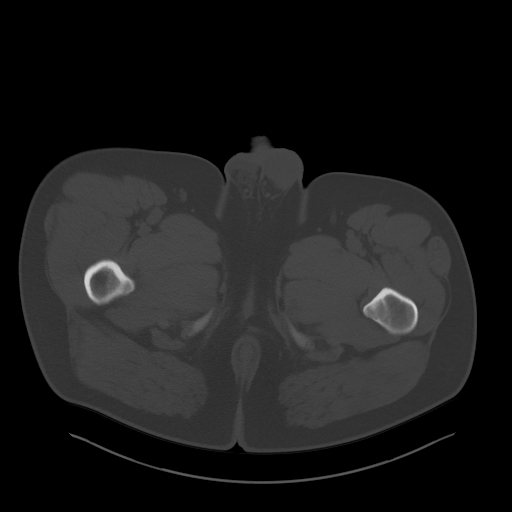
[im 16/106  soft-tissue]
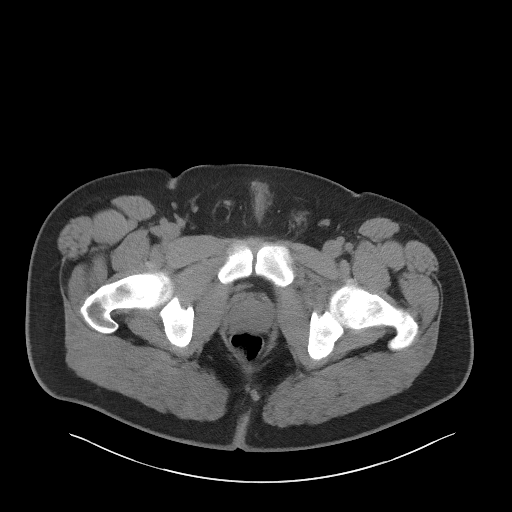
[im 21/106  soft-tissue]
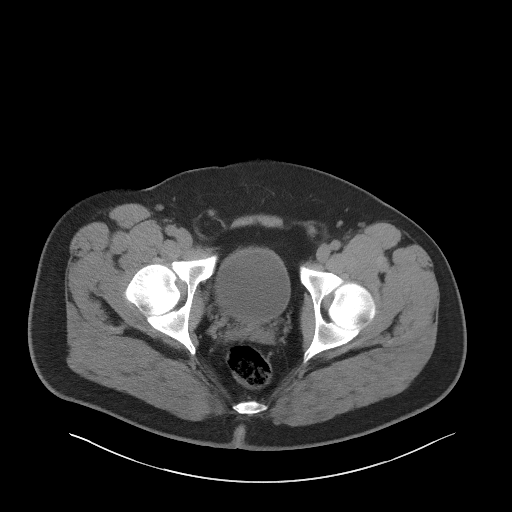
[im 31/106  soft-tissue]
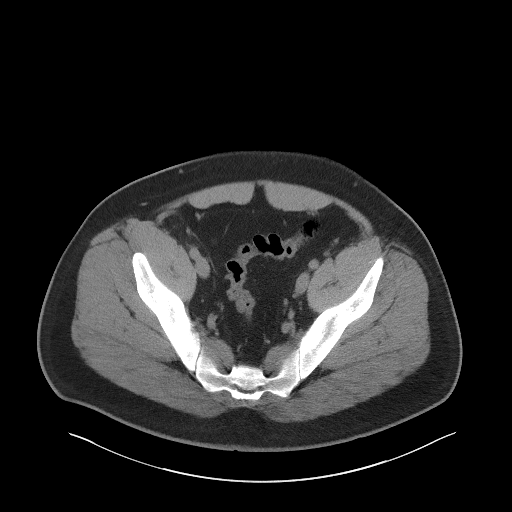
[im 36/106  soft-tissue]
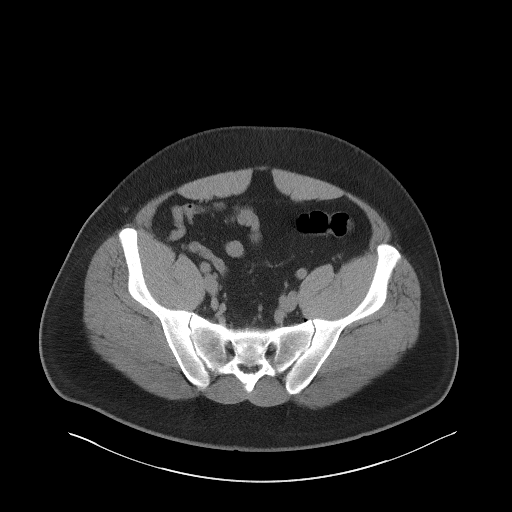
[im 46/106  soft-tissue]
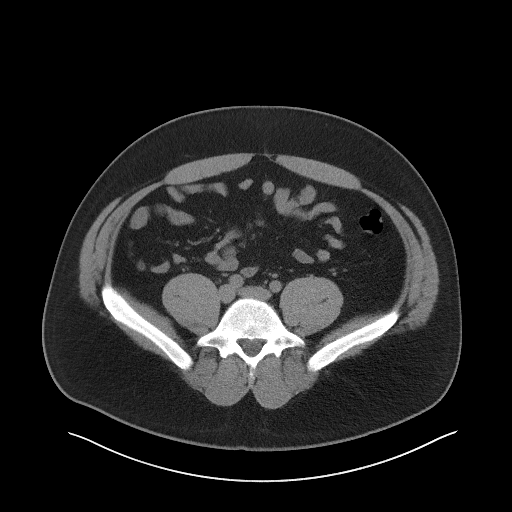
[im 56/106  soft-tissue]
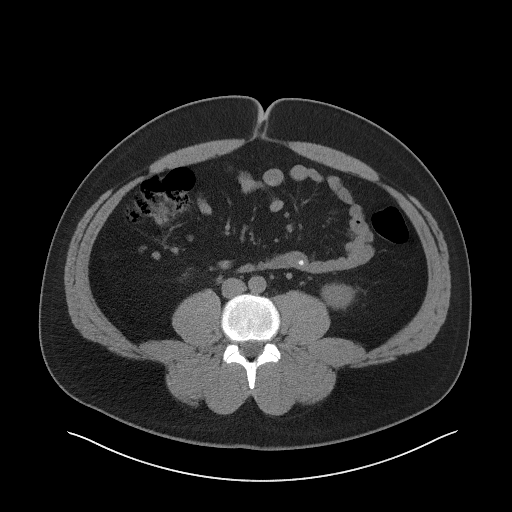
[im 61/106  soft-tissue]
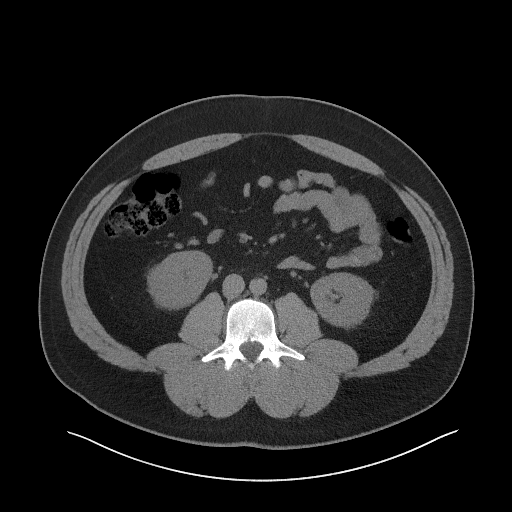
[im 71/106  soft-tissue]
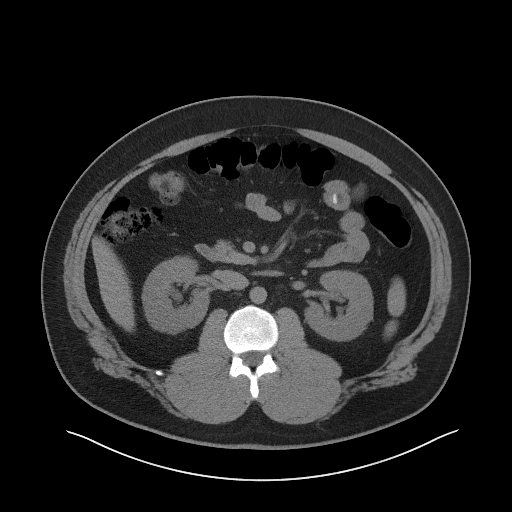
[im 71/106  bone]
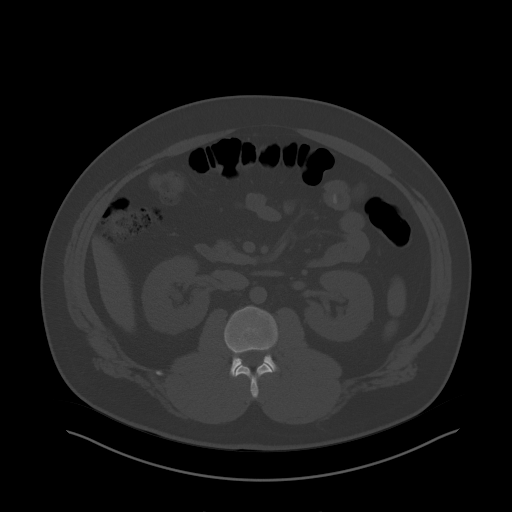
[im 76/106  soft-tissue]
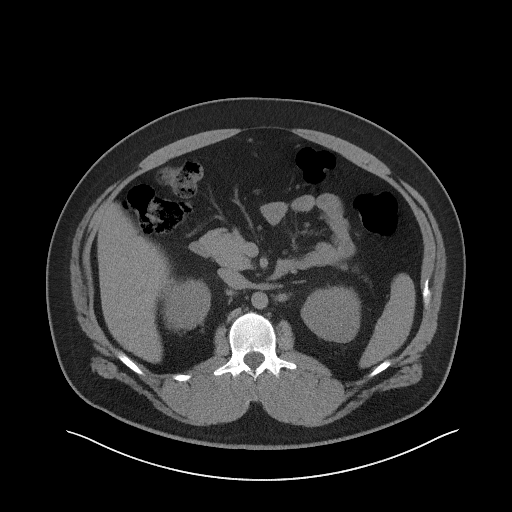
[im 86/106  soft-tissue]
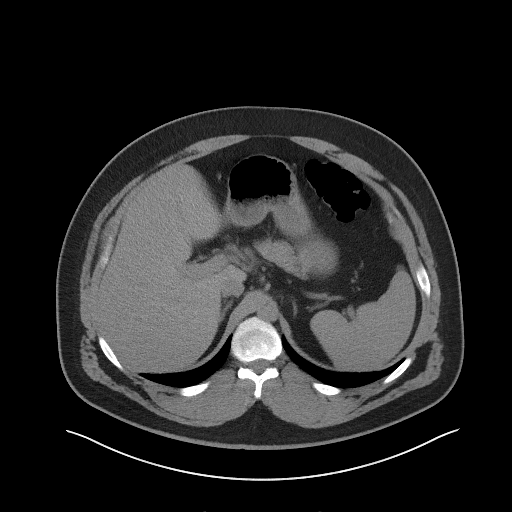
[im 91/106  soft-tissue]
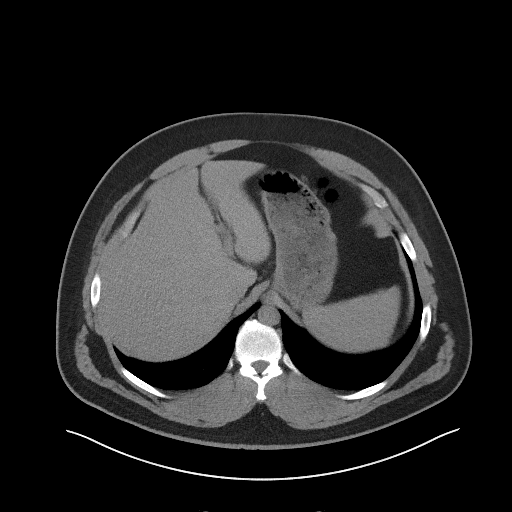
[im 101/106  soft-tissue]
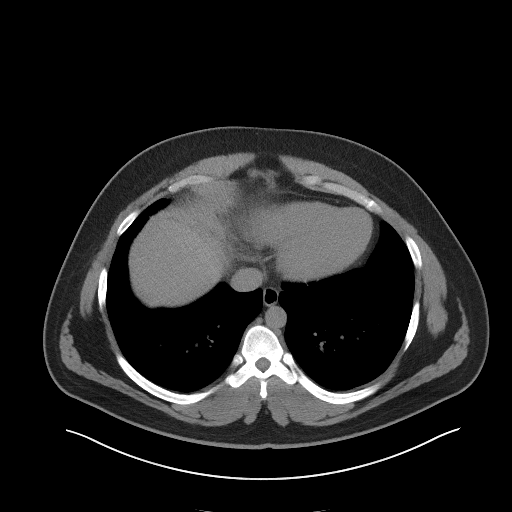

[Series 5: coronal · coronal · 0.83mm/px · 3 of 162 slices shown]
[im 54/162  soft-tissue]
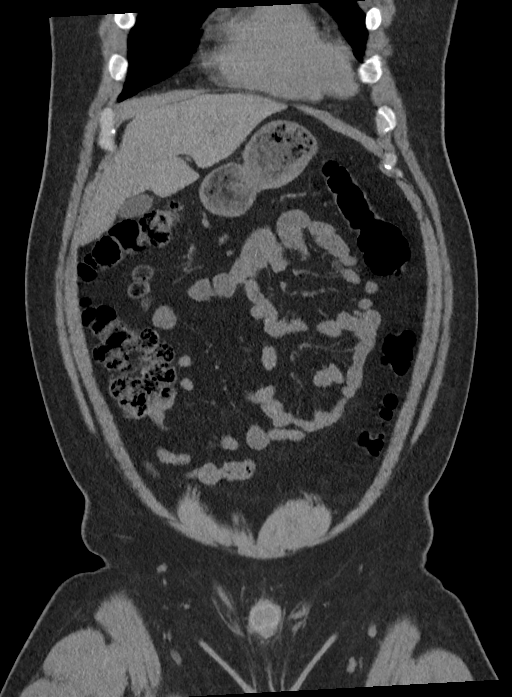
[im 72/162  soft-tissue]
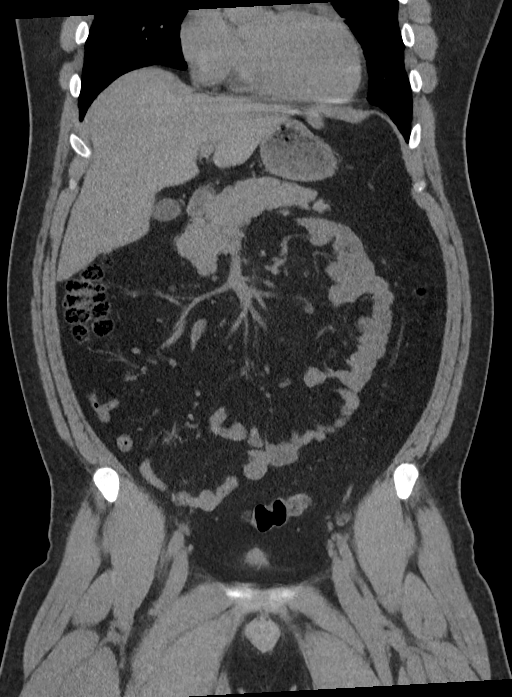
[im 90/162  soft-tissue]
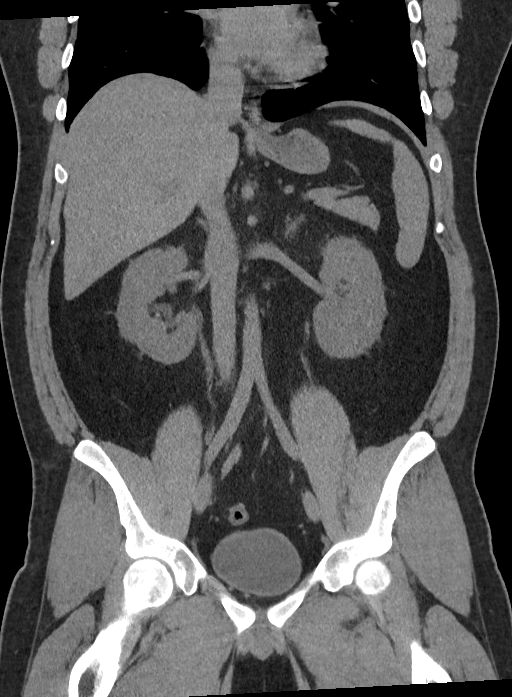

[16 of 46 positions shown; findings below may reference images not displayed]

FINDINGS: Evaluation of this exam is limited in the absence of intravenous
contrast.

Lower chest: The visualized lung bases are clear.

No intra-abdominal free air or free fluid.

Hepatobiliary: No focal liver abnormality is seen. No gallstones,
gallbladder wall thickening, or biliary dilatation.

Pancreas: Unremarkable. No pancreatic ductal dilatation or
surrounding inflammatory changes.

Spleen: Normal in size without focal abnormality.

Adrenals/Urinary Tract: The adrenal glands unremarkable. There is a
7 mm stone in the proximal right ureter with mild right
hydronephrosis. The left kidney is unremarkable. The left ureter and
urinary bladder appear unremarkable.

Stomach/Bowel: There is no bowel obstruction or active inflammation.
The appendix is normal.

Vascular/Lymphatic: The abdominal aorta and IVC unremarkable. No
portal venous gas. There is no adenopathy.

Reproductive: The prostate and seminal vesicles are grossly
unremarkable. No pelvic mass

Other: None

Musculoskeletal: No acute or significant osseous findings.
IMPRESSION: A 7 mm proximal right ureteral stone with mild right hydronephrosis.

## 2022-06-01 ENCOUNTER — Telehealth: Payer: Self-pay | Admitting: Family Medicine

## 2022-06-01 NOTE — Telephone Encounter (Signed)
Copy emailed to pt and pt notified.

## 2022-06-01 NOTE — Telephone Encounter (Signed)
Patient called in and stated he needs a copy of his immunization record for school. He stated that it can be uploaded to his mychart or sent to his email: [email or web address]. Thank you!
